# Patient Record
Sex: Male | Born: 1940 | Race: White | Hispanic: No | Marital: Married | Smoking: Former smoker
Health system: Southern US, Community
[De-identification: ages and names within clinical notes are randomized; demographics above are authoritative.]

## PROBLEM LIST (undated history)

## (undated) DIAGNOSIS — R399 Unspecified symptoms and signs involving the genitourinary system: Secondary | ICD-10-CM

## (undated) DIAGNOSIS — K219 Gastro-esophageal reflux disease without esophagitis: Secondary | ICD-10-CM

## (undated) DIAGNOSIS — K449 Diaphragmatic hernia without obstruction or gangrene: Secondary | ICD-10-CM

## (undated) DIAGNOSIS — Z87898 Personal history of other specified conditions: Secondary | ICD-10-CM

## (undated) DIAGNOSIS — Z8619 Personal history of other infectious and parasitic diseases: Secondary | ICD-10-CM

## (undated) DIAGNOSIS — N400 Enlarged prostate without lower urinary tract symptoms: Secondary | ICD-10-CM

## (undated) DIAGNOSIS — M1990 Unspecified osteoarthritis, unspecified site: Secondary | ICD-10-CM

## (undated) DIAGNOSIS — Z973 Presence of spectacles and contact lenses: Secondary | ICD-10-CM

## (undated) HISTORY — PX: VARICOSE VEIN SURGERY: SHX832

## (undated) HISTORY — PX: COLONOSCOPY: SHX174

## (undated) HISTORY — PX: INGUINAL HERNIA REPAIR: SUR1180

## (undated) HISTORY — DX: Personal history of other infectious and parasitic diseases: Z86.19

## (undated) HISTORY — PX: PILONIDAL CYST / SINUS EXCISION: SUR543

---
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
## 2004-10-07 ENCOUNTER — Encounter: Admission: RE | Admit: 2004-10-07 | Discharge: 2004-10-07 | Payer: Self-pay | Admitting: Otolaryngology

## 2005-10-11 IMAGING — CT CT NECK W/ CM
1 of 2 series · 10 of 14 positions shown, 13 images · IV contrast (75ML OMNI 300)
Comparison: none

CLINICAL DATA: Left salivary gland palpable abnormality.  Numbness left TM joint region.
TECHNIQUE: Multidetector spiral axial images were obtained from the level of the frontal sinuses to the aortic arch with IV injection of 75 cc Omnipaque 300.  
 CT NECK WITH CONTRAST:
 The bilateral parotid and submandibular glands are normal by CT assessment.  No cervical adenopathy is seen.  Naso-oro-hypopharynx, larynx appear normal by CT assessment.  3 mm low density focus at otherwise normal size thyroid is likely incidental adenoma.  Lung apices are clear with no superior mediastinal adenopathy/mass.  Visualized paranasal sinuses, bilateral middle ear, external auditory canals and mastoid air cells appear clear.  Degenerative change is seen at the left uncinate joint of C4-5 and bilateral uncinate joints of C5-6 with slight posterior osteophytic ridging, uncinate degenerative joint disease and slight bilateral neural foraminal narrowing at C5-6 and right C6-7.

[Series 2: neck w/ · axial · 0.39mm/px · z∈[-271,-19]mm · 10 of 125 slices shown, 13 images]
[im 12/125  soft-tissue]
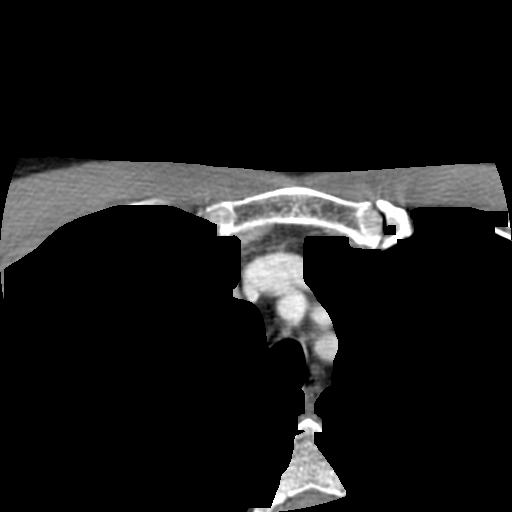
[im 12/125  bone]
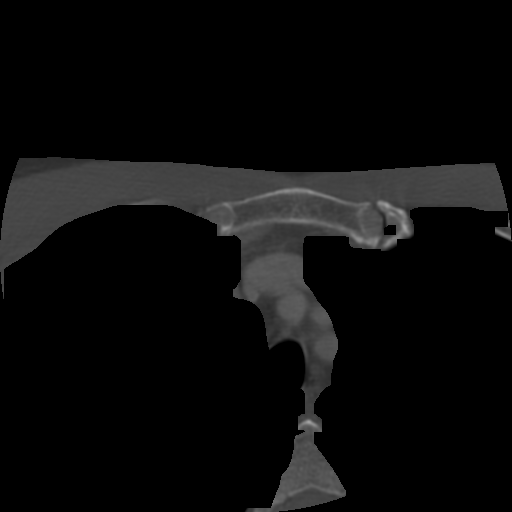
[im 23/125  bone]
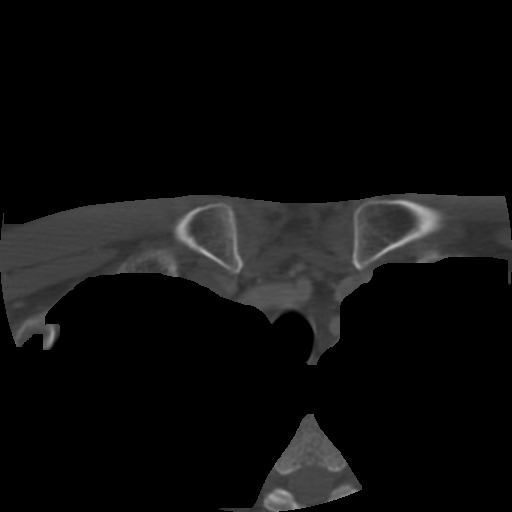
[im 34/125  bone]
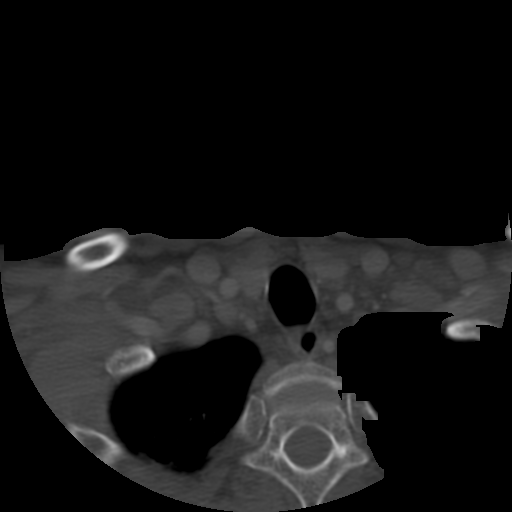
[im 46/125  bone]
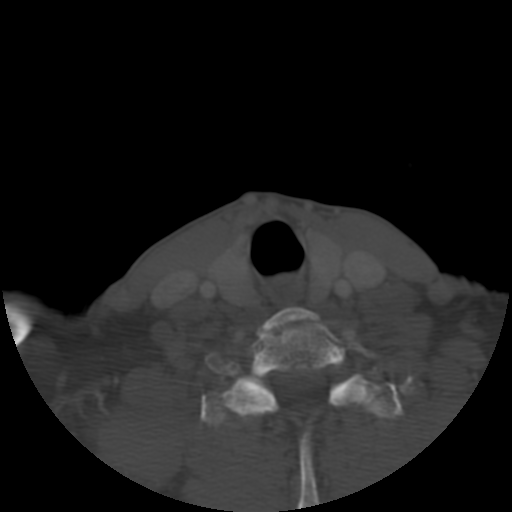
[im 57/125  soft-tissue]
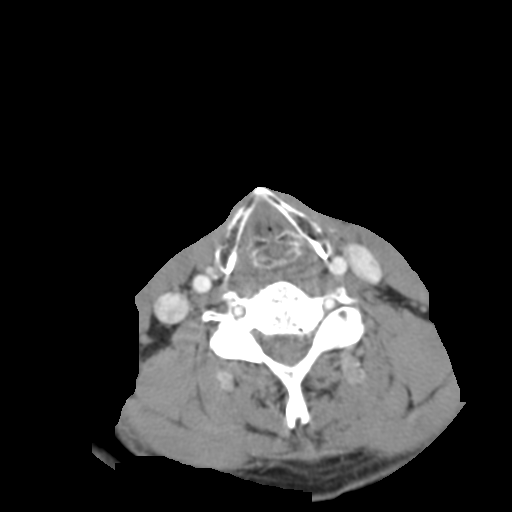
[im 57/125  bone]
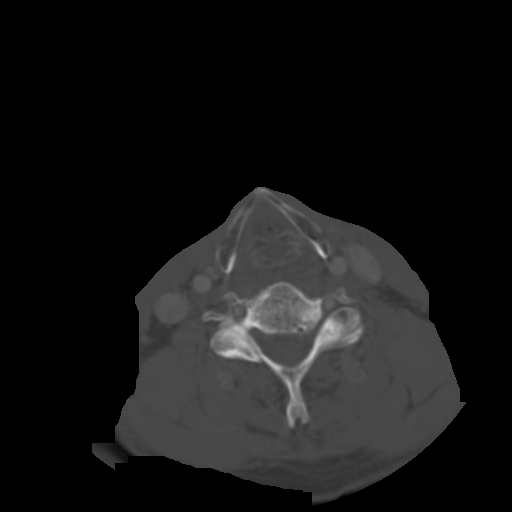
[im 68/125  bone]
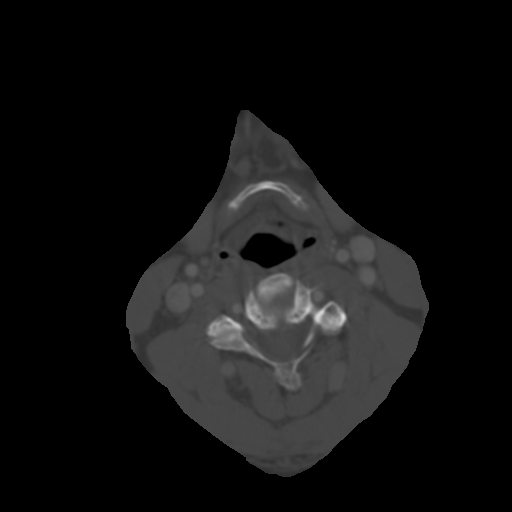
[im 79/125  bone]
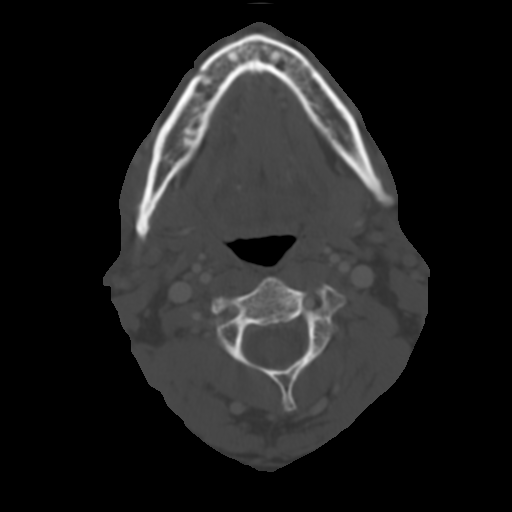
[im 91/125  bone]
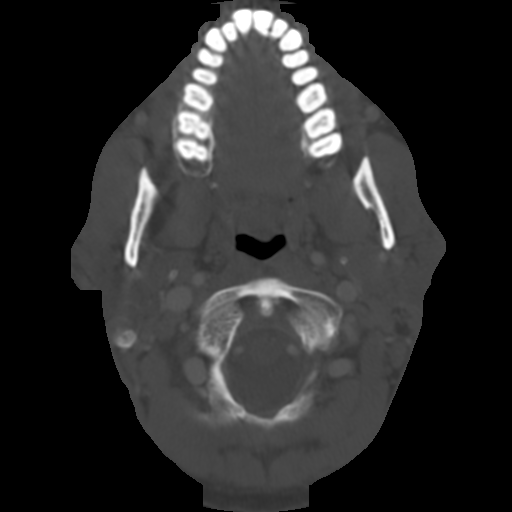
[im 102/125  soft-tissue]
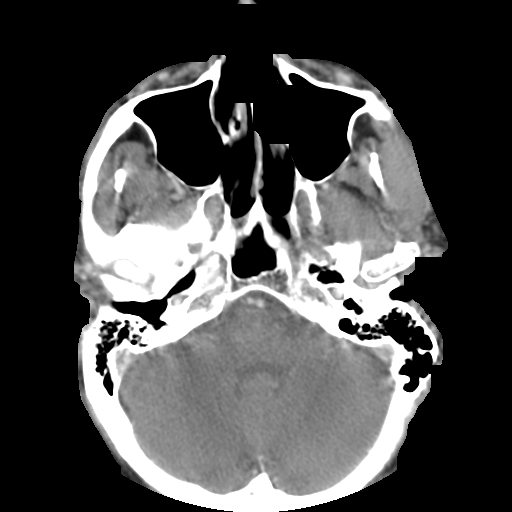
[im 102/125  bone]
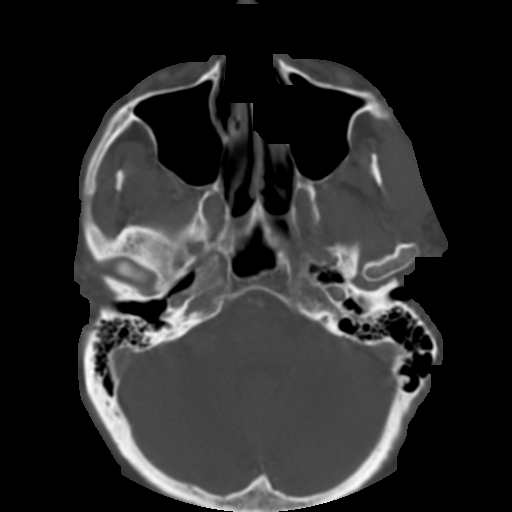
[im 113/125  bone]
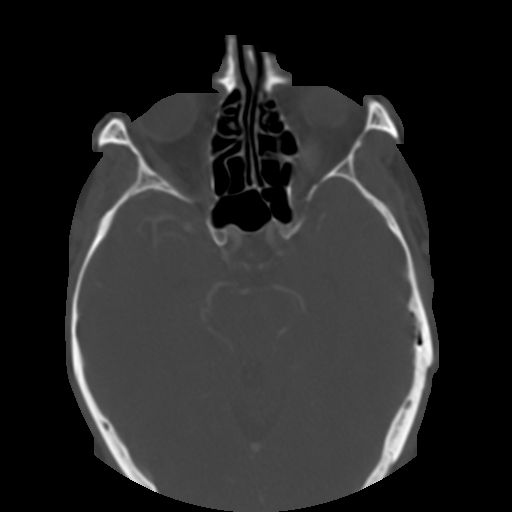

[10 of 14 positions shown; findings below may reference images not displayed]

IMPRESSION: 1.  Degenerative changes in the cervical spine as described.
 2.  Likely incidental 3 mm left thyroid adenoma.
 3.  Otherwise negative.

## 2006-09-03 ENCOUNTER — Encounter: Admission: RE | Admit: 2006-09-03 | Discharge: 2006-09-03 | Payer: Self-pay | Admitting: Family Medicine

## 2008-12-05 HISTORY — PX: CARDIOVASCULAR STRESS TEST: SHX262

## 2011-03-27 ENCOUNTER — Telehealth: Payer: Self-pay | Admitting: Cardiology

## 2011-04-07 ENCOUNTER — Telehealth: Payer: Self-pay | Admitting: Cardiovascular Disease

## 2011-04-07 NOTE — Telephone Encounter (Signed)
Faxed Last OV Note

## 2011-05-29 NOTE — Telephone Encounter (Signed)
No further information

## 2012-01-19 ENCOUNTER — Encounter (HOSPITAL_COMMUNITY): Payer: Self-pay | Admitting: *Deleted

## 2012-01-19 ENCOUNTER — Emergency Department (HOSPITAL_COMMUNITY): Payer: 59

## 2012-01-19 ENCOUNTER — Emergency Department (HOSPITAL_COMMUNITY)
Admission: EM | Admit: 2012-01-19 | Discharge: 2012-01-19 | Disposition: A | Discharge status: Home / Self Care | Payer: 59 | Attending: Emergency Medicine | Admitting: Emergency Medicine

## 2012-01-19 DIAGNOSIS — R7989 Other specified abnormal findings of blood chemistry: Secondary | ICD-10-CM | POA: Insufficient documentation

## 2012-01-19 DIAGNOSIS — K219 Gastro-esophageal reflux disease without esophagitis: Secondary | ICD-10-CM | POA: Insufficient documentation

## 2012-01-19 DIAGNOSIS — K449 Diaphragmatic hernia without obstruction or gangrene: Secondary | ICD-10-CM | POA: Insufficient documentation

## 2012-01-19 DIAGNOSIS — R0781 Pleurodynia: Secondary | ICD-10-CM

## 2012-01-19 DIAGNOSIS — R0602 Shortness of breath: Secondary | ICD-10-CM | POA: Insufficient documentation

## 2012-01-19 DIAGNOSIS — R071 Chest pain on breathing: Secondary | ICD-10-CM | POA: Insufficient documentation

## 2012-01-19 HISTORY — DX: Gastro-esophageal reflux disease without esophagitis: K21.9

## 2012-01-19 LAB — DIFFERENTIAL
Basophils Absolute: 0 K/uL (ref 0.0–0.1)
Basophils Relative: 1 % (ref 0–1)
Eosinophils Absolute: 0.1 K/uL (ref 0.0–0.7)
Eosinophils Relative: 2 % (ref 0–5)
Lymphocytes Relative: 28 % (ref 12–46)
Lymphs Abs: 1.5 10*3/uL (ref 0.7–4.0)
Monocytes Absolute: 0.5 10*3/uL (ref 0.1–1.0)
Monocytes Relative: 9 % (ref 3–12)
Neutro Abs: 3.3 K/uL (ref 1.7–7.7)
Neutrophils Relative %: 61 % (ref 43–77)

## 2012-01-19 LAB — CBC
HCT: 40.8 % (ref 39.0–52.0)
Hemoglobin: 14.3 g/dL (ref 13.0–17.0)
MCH: 31.8 pg (ref 26.0–34.0)
MCHC: 35 g/dL (ref 30.0–36.0)
MCV: 90.9 fL (ref 78.0–100.0)
Platelets: 191 10*3/uL (ref 150–400)
RBC: 4.49 MIL/uL (ref 4.22–5.81)
RDW: 12.3 % (ref 11.5–15.5)
WBC: 5.5 10*3/uL (ref 4.0–10.5)

## 2012-01-19 LAB — D-DIMER, QUANTITATIVE: D-Dimer, Quant: <0.22 ug{FEU}/mL (ref 0.00–0.48)

## 2012-01-19 LAB — BASIC METABOLIC PANEL
CO2: 26 mEq/L (ref 19–32)
Calcium: 8.9 mg/dL (ref 8.4–10.5)
Chloride: 108 mEq/L (ref 96–112)
Glucose, Bld: 109 mg/dL — ABNORMAL HIGH (ref 70–99)
Sodium: 142 mEq/L (ref 135–145)

## 2012-01-19 LAB — TROPONIN I: Troponin I: <0.3 ng/mL (ref <0.30)

## 2012-01-19 LAB — BASIC METABOLIC PANEL WITH GFR
BUN: 27 mg/dL — ABNORMAL HIGH (ref 6–23)
Creatinine, Ser: 1.1 mg/dL (ref 0.50–1.35)
GFR calc Af Amer: 78 mL/min — ABNORMAL LOW (ref >90)
GFR calc non Af Amer: 67 mL/min — ABNORMAL LOW (ref >90)
Potassium: 4.4 meq/L (ref 3.5–5.1)

## 2012-01-19 MED ORDER — GI COCKTAIL ~~LOC~~
30.0000 mL | Freq: Once | ORAL | Status: AC
  Administered 2012-01-19: 30 mL via ORAL
  Filled 2012-01-19: qty 30

## 2012-01-19 MED ORDER — SODIUM CHLORIDE 0.9 % IV BOLUS (SEPSIS)
1000.0000 mL | Freq: Once | INTRAVENOUS | Status: AC
  Administered 2012-01-19: 1000 mL via INTRAVENOUS

## 2012-01-19 MED ORDER — KETOROLAC TROMETHAMINE 30 MG/ML IJ SOLN
30.0000 mg | Freq: Once | INTRAMUSCULAR | Status: AC
  Administered 2012-01-19: 30 mg via INTRAVENOUS
  Filled 2012-01-19: qty 1

## 2012-01-19 NOTE — ED Notes (Signed)
Per EMS pt from home with c/o chest pain onset around 0500. Pt reports pain sharp with deep inspiration. Pt received Aspirin 324 mg PO and Nitro x 1 with no change in pain. Alert and oriented x 4. IV LFA 18G. Denies nausea/vomiting. VS BP 120/74 HR 62 NSR.

## 2012-01-19 NOTE — ED Notes (Signed)
MD at bedside. 

## 2012-01-19 NOTE — ED Provider Notes (Signed)
History     CSN: [id number]  Arrival date & time 01/19/12  4098   First MD Initiated Contact with Patient 01/19/12 [phone]      Chief Complaint  Patient presents with  . Chest Pain    (Consider location/radiation/quality/duration/timing/severity/associated sxs/prior treatment) HPI Comments: Pt reports woke up earlier this AM with a sharp pain in left breast area, non radiating worse with deep inspiration.  Has a slight steady ache now since pain first began.  But with holding breath or shallow breathing, no pain.  No fevers, cough, no congestion, denies eating anything unusual last night, no bloating.  He has burping and gas, but not unusual for him.  No constipation, diarrhea.  No sweats, feels a little SOB because he can't breathe deeply.  No recent travel, no lower leg swelling or edema or pain.  He has h/o hiatal hernia, takes prilosec.  Denies smoking history.    Patient is a 71 y.o. male presenting with chest pain. The history is provided by the patient.  Chest Pain Primary symptoms include shortness of breath. Pertinent negatives for primary symptoms include no fever, no cough, no palpitations, no abdominal pain, no nausea, no vomiting and no dizziness.  Pertinent negatives for associated symptoms include no diaphoresis.     Past Medical History  Diagnosis Date  . GERD (gastroesophageal reflux disease)     History reviewed. No pertinent past surgical history.  History reviewed. No pertinent family history.  History  Substance Use Topics  . Smoking status: Never Smoker   . Smokeless tobacco: Not on file  . Alcohol Use: No      Review of Systems  Constitutional: Negative for fever, chills and diaphoresis.  HENT: Negative for congestion.   Respiratory: Positive for shortness of breath. Negative for cough.   Cardiovascular: Positive for chest pain. Negative for palpitations and leg swelling.  Gastrointestinal: Negative for nausea, vomiting, abdominal pain and diarrhea.    Neurological: Negative for dizziness, light-headedness and headaches.  All other systems reviewed and are negative.    Allergies  Review of patient's allergies indicates no known allergies.  Home Medications   Current Outpatient Rx  Name Route Sig Dispense Refill  . VITAMIN D PO Oral Take 1 tablet by mouth daily.    Marland Kitchen COENZYME Q10 30 MG PO CAPS Oral Take 30 mg by mouth daily.    . IBUPROFEN 200 MG PO TABS Oral Take 600 mg by mouth daily.    . ADULT MULTIVITAMIN W/MINERALS CH Oral Take 1 tablet by mouth daily.    . OMEGA-3-ACID ETHYL ESTERS 1 G PO CAPS Oral Take 1 g by mouth daily.    Marland Kitchen OMEPRAZOLE MAGNESIUM 20 MG PO TBEC Oral Take 20 mg by mouth 2 (two) times daily.      BP 125/80  Pulse 60  Temp(Src) 98.2 F (36.8 C) (Oral)  Resp 16  Ht 5\' 11"  (1.803 m)  Wt 185 lb (83.915 kg)  BMI 25.80 kg/m2  SpO2 100%  Physical Exam  Nursing note and vitals reviewed. Constitutional: He is oriented to person, place, and time. He appears well-developed and well-nourished. No distress.  HENT:  Head: Normocephalic and atraumatic.  Eyes: Pupils are equal, round, and reactive to light. No scleral icterus.  Neck: Neck supple.  Cardiovascular: Normal rate and regular rhythm.   No murmur heard. Pulmonary/Chest: Effort normal. No respiratory distress. He has no wheezes. He has no rales.  Abdominal: Soft. He exhibits no distension. There is no tenderness. There is  no rebound and no guarding.  Musculoskeletal: He exhibits no edema and no tenderness.  Neurological: He is alert and oriented to person, place, and time.  Skin: Skin is warm and dry. No rash noted. No pallor.  Psychiatric: He has a normal mood and affect.    ED Course  Procedures (including critical care time)  Labs Reviewed  BASIC METABOLIC PANEL - Abnormal; Notable for the following:    Glucose, Bld 109 (*)    BUN 27 (*)    GFR calc non Af Amer 67 (*)    GFR calc Af Amer 78 (*)    All other components within normal limits   D-DIMER, QUANTITATIVE  CBC  DIFFERENTIAL  TROPONIN I   Dg Chest Port 1 View  01/19/2012  *RADIOLOGY REPORT*  Clinical Data:  chest pain, shortness of breath  PORTABLE CHEST - 1 VIEW  Comparison: 04/29/2010  Findings: Normal heart size and vascularity.  No focal airspace disease, collapse, consolidation, edema, effusion or pneumothorax. Trachea midline.  IMPRESSION: No acute chest process  Original Report Authenticated By: Judie Petit. TREVOR Miles Costain, M.D.     1. Pleuritic chest pain     RA sat is 100% and normal  ECG at time 07:24 shows sinus rhythm with normal axis, normal intervals, no ST or T wave abn's.  No prior ECG   8:47 AM BUN is quite elevated.  Will give IVF bolus.  ddimer is normal.  PCXR is normal per radiologist. I reviewed it myself and reviewed interpretation.  GI cocktail did not improve pain, by history, pain is quite pleuritic, will give IV toradol.     10:22 AM Pt feels somewhat improved, no arrythmia on monitor, discussed findings of labs, CXR with pt and family, they agree with NSAIDs at home and rest, can follow up with PCP, return if worse or symptoms change.    MDM  Pleuritic pain.  Pt has h/o GERD and hiatal hernia, will try gi cocktail.  No ischemia on ECG.  No evidence of ACS based on history or exam.  Will check CXR and ddimer, although no obvious risk for PE.          Gavin Pound. Zitlaly Malson, MD 01/19/12 1022

## 2012-01-19 NOTE — ED Notes (Signed)
Pt reports sharp pain on inspiration since 0500. States did a lot of yardwork yesterday, building a wall in the garden. Denies shortness of breath- states hard to take deep breath. Denies dizziness, nausea/vomiting, diaphoresis.

## 2012-02-04 ENCOUNTER — Encounter: Payer: Self-pay | Admitting: *Deleted

## 2012-07-04 DIAGNOSIS — Z79899 Other long term (current) drug therapy: Secondary | ICD-10-CM | POA: Diagnosis not present

## 2012-07-04 DIAGNOSIS — Z23 Encounter for immunization: Secondary | ICD-10-CM | POA: Diagnosis not present

## 2012-07-04 DIAGNOSIS — Z136 Encounter for screening for cardiovascular disorders: Secondary | ICD-10-CM | POA: Diagnosis not present

## 2012-07-04 DIAGNOSIS — N529 Male erectile dysfunction, unspecified: Secondary | ICD-10-CM | POA: Diagnosis not present

## 2012-07-04 DIAGNOSIS — N400 Enlarged prostate without lower urinary tract symptoms: Secondary | ICD-10-CM | POA: Diagnosis not present

## 2012-07-04 DIAGNOSIS — Z0000 Encounter for general adult medical examination without abnormal findings: Secondary | ICD-10-CM | POA: Diagnosis not present

## 2012-07-04 DIAGNOSIS — Z6826 Body mass index (BMI) 26.0-26.9, adult: Secondary | ICD-10-CM | POA: Diagnosis not present

## 2012-07-04 DIAGNOSIS — Z125 Encounter for screening for malignant neoplasm of prostate: Secondary | ICD-10-CM | POA: Diagnosis not present

## 2012-08-04 DIAGNOSIS — K219 Gastro-esophageal reflux disease without esophagitis: Secondary | ICD-10-CM | POA: Diagnosis not present

## 2012-08-04 DIAGNOSIS — E291 Testicular hypofunction: Secondary | ICD-10-CM | POA: Diagnosis not present

## 2012-08-04 DIAGNOSIS — N400 Enlarged prostate without lower urinary tract symptoms: Secondary | ICD-10-CM | POA: Diagnosis not present

## 2012-10-18 ENCOUNTER — Encounter: Payer: Self-pay | Admitting: Cardiovascular Disease

## 2013-01-22 IMAGING — DX DG CHEST 1V PORT
1 series · 1 of 1 positions shown · non-contrast
Comparison: 04/29/2010

CLINICAL DATA: chest pain, shortness of breath

PORTABLE CHEST - 1 VIEW

[AP]
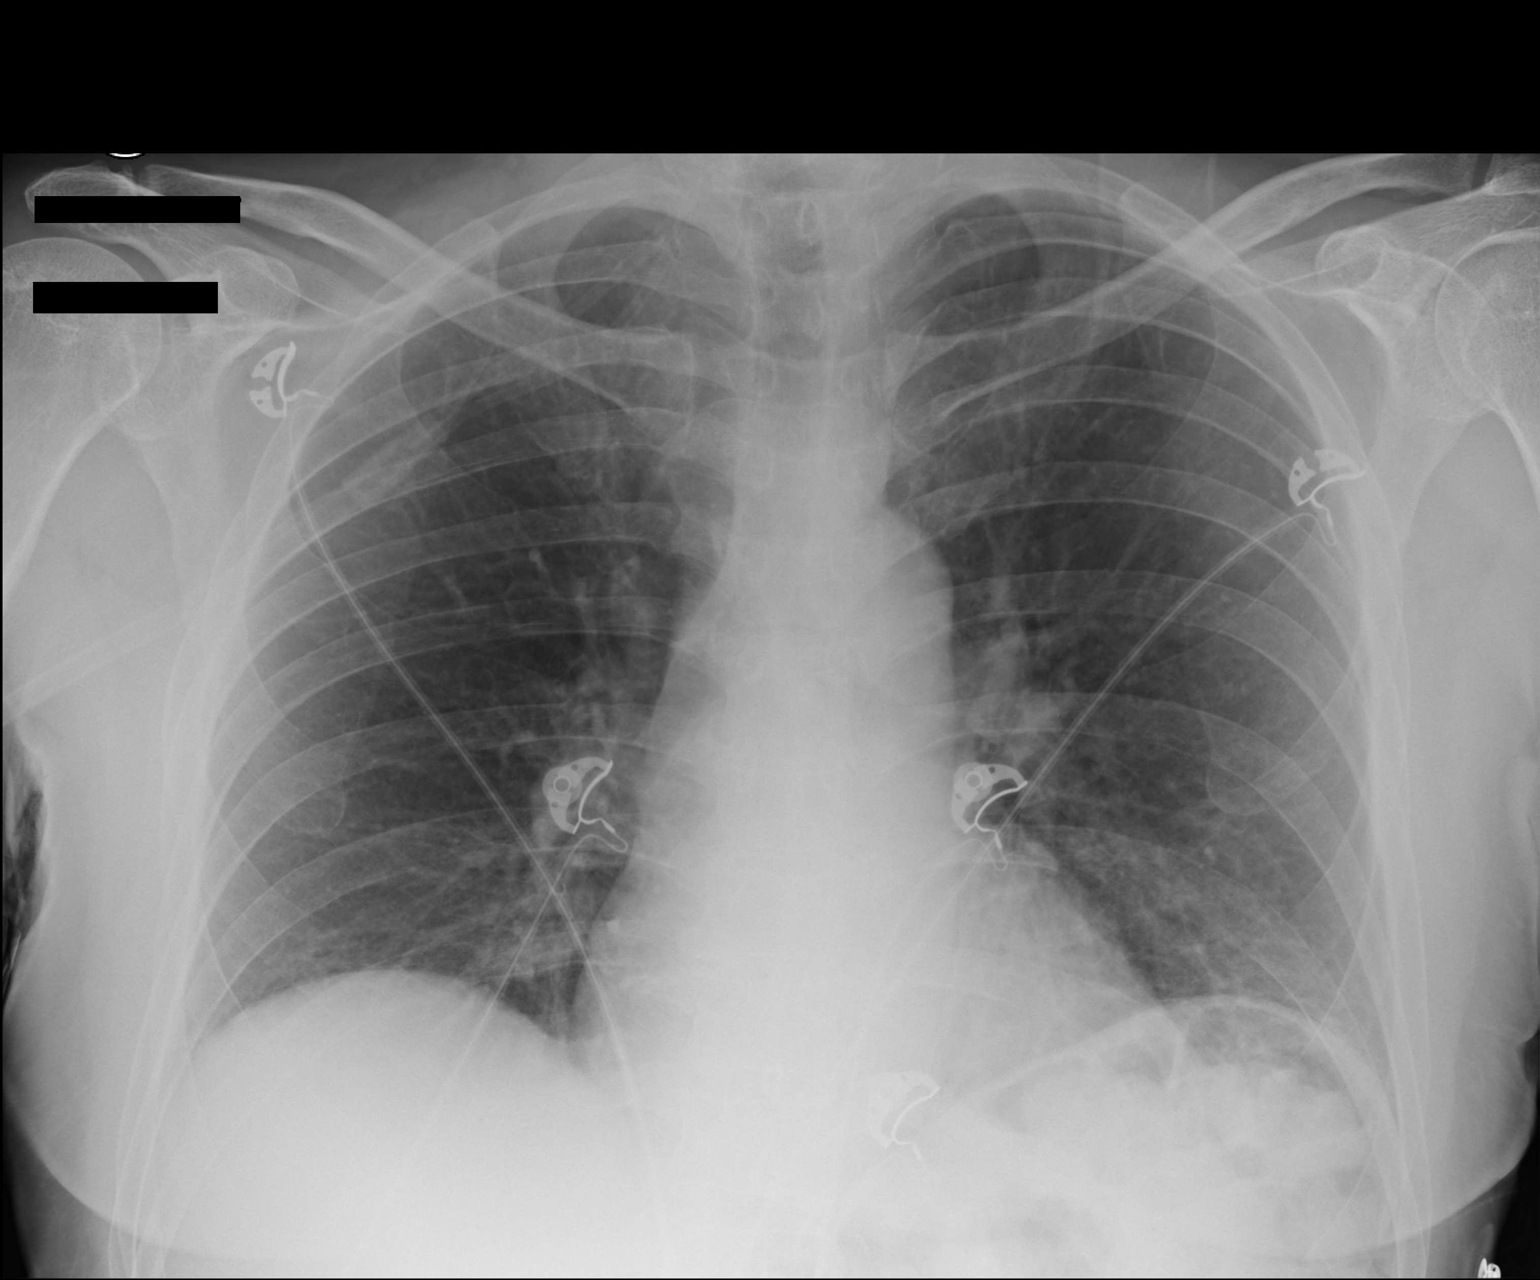

[1 of 1 positions shown; findings below may reference images not displayed]

FINDINGS: Normal heart size and vascularity.  No focal airspace
disease, collapse, consolidation, edema, effusion or pneumothorax.
Trachea midline.
IMPRESSION: No acute chest process

## 2014-02-13 DIAGNOSIS — N410 Acute prostatitis: Secondary | ICD-10-CM | POA: Diagnosis not present

## 2014-02-13 DIAGNOSIS — R5381 Other malaise: Secondary | ICD-10-CM | POA: Diagnosis not present

## 2014-02-13 DIAGNOSIS — R6889 Other general symptoms and signs: Secondary | ICD-10-CM | POA: Diagnosis not present

## 2014-02-13 DIAGNOSIS — N138 Other obstructive and reflux uropathy: Secondary | ICD-10-CM | POA: Diagnosis not present

## 2014-02-13 DIAGNOSIS — Z125 Encounter for screening for malignant neoplasm of prostate: Secondary | ICD-10-CM | POA: Diagnosis not present

## 2014-02-13 DIAGNOSIS — B070 Plantar wart: Secondary | ICD-10-CM | POA: Diagnosis not present

## 2014-02-13 DIAGNOSIS — N401 Enlarged prostate with lower urinary tract symptoms: Secondary | ICD-10-CM | POA: Diagnosis not present

## 2014-02-27 DIAGNOSIS — B070 Plantar wart: Secondary | ICD-10-CM | POA: Diagnosis not present

## 2014-11-05 DIAGNOSIS — N400 Enlarged prostate without lower urinary tract symptoms: Secondary | ICD-10-CM | POA: Diagnosis not present

## 2014-11-05 DIAGNOSIS — Z6826 Body mass index (BMI) 26.0-26.9, adult: Secondary | ICD-10-CM | POA: Diagnosis not present

## 2014-11-05 DIAGNOSIS — R0789 Other chest pain: Secondary | ICD-10-CM | POA: Diagnosis not present

## 2014-11-05 DIAGNOSIS — Z9181 History of falling: Secondary | ICD-10-CM | POA: Diagnosis not present

## 2014-11-05 DIAGNOSIS — Z136 Encounter for screening for cardiovascular disorders: Secondary | ICD-10-CM | POA: Diagnosis not present

## 2014-11-05 DIAGNOSIS — Z79899 Other long term (current) drug therapy: Secondary | ICD-10-CM | POA: Diagnosis not present

## 2014-11-05 DIAGNOSIS — Z0000 Encounter for general adult medical examination without abnormal findings: Secondary | ICD-10-CM | POA: Diagnosis not present

## 2014-11-05 DIAGNOSIS — K219 Gastro-esophageal reflux disease without esophagitis: Secondary | ICD-10-CM | POA: Diagnosis not present

## 2015-02-05 DIAGNOSIS — Z6825 Body mass index (BMI) 25.0-25.9, adult: Secondary | ICD-10-CM | POA: Diagnosis not present

## 2015-02-05 DIAGNOSIS — N400 Enlarged prostate without lower urinary tract symptoms: Secondary | ICD-10-CM | POA: Diagnosis not present

## 2015-05-08 DIAGNOSIS — Z125 Encounter for screening for malignant neoplasm of prostate: Secondary | ICD-10-CM | POA: Diagnosis not present

## 2015-05-08 DIAGNOSIS — Z79899 Other long term (current) drug therapy: Secondary | ICD-10-CM | POA: Diagnosis not present

## 2015-11-15 DIAGNOSIS — H2513 Age-related nuclear cataract, bilateral: Secondary | ICD-10-CM | POA: Diagnosis not present

## 2015-11-19 DIAGNOSIS — R6889 Other general symptoms and signs: Secondary | ICD-10-CM | POA: Diagnosis not present

## 2015-11-19 DIAGNOSIS — Z1389 Encounter for screening for other disorder: Secondary | ICD-10-CM | POA: Diagnosis not present

## 2015-11-19 DIAGNOSIS — K219 Gastro-esophageal reflux disease without esophagitis: Secondary | ICD-10-CM | POA: Diagnosis not present

## 2015-11-19 DIAGNOSIS — L989 Disorder of the skin and subcutaneous tissue, unspecified: Secondary | ICD-10-CM | POA: Diagnosis not present

## 2015-11-19 DIAGNOSIS — N400 Enlarged prostate without lower urinary tract symptoms: Secondary | ICD-10-CM | POA: Diagnosis not present

## 2015-11-19 DIAGNOSIS — Z6826 Body mass index (BMI) 26.0-26.9, adult: Secondary | ICD-10-CM | POA: Diagnosis not present

## 2015-11-19 DIAGNOSIS — Z9181 History of falling: Secondary | ICD-10-CM | POA: Diagnosis not present

## 2015-11-26 DIAGNOSIS — E663 Overweight: Secondary | ICD-10-CM | POA: Diagnosis not present

## 2015-11-26 DIAGNOSIS — Z6826 Body mass index (BMI) 26.0-26.9, adult: Secondary | ICD-10-CM | POA: Diagnosis not present

## 2015-11-26 DIAGNOSIS — L820 Inflamed seborrheic keratosis: Secondary | ICD-10-CM | POA: Diagnosis not present

## 2015-11-26 DIAGNOSIS — L989 Disorder of the skin and subcutaneous tissue, unspecified: Secondary | ICD-10-CM | POA: Diagnosis not present

## 2015-12-11 DIAGNOSIS — H2513 Age-related nuclear cataract, bilateral: Secondary | ICD-10-CM | POA: Diagnosis not present

## 2015-12-16 ENCOUNTER — Encounter: Payer: Self-pay | Admitting: *Deleted

## 2015-12-19 ENCOUNTER — Ambulatory Visit: Payer: Medicare Other | Admitting: Anesthesiology

## 2015-12-19 ENCOUNTER — Ambulatory Visit
Admission: RE | Admit: 2015-12-19 | Discharge: 2015-12-19 | Disposition: A | Discharge status: Home / Self Care | Payer: Medicare Other | Source: Ambulatory Visit | Attending: Ophthalmology | Admitting: Ophthalmology

## 2015-12-19 ENCOUNTER — Encounter
Admission: RE | Disposition: A | Discharge status: Home / Self Care | Payer: Self-pay | Source: Ambulatory Visit | Attending: Ophthalmology

## 2015-12-19 DIAGNOSIS — H2511 Age-related nuclear cataract, right eye: Secondary | ICD-10-CM | POA: Insufficient documentation

## 2015-12-19 DIAGNOSIS — M7989 Other specified soft tissue disorders: Secondary | ICD-10-CM | POA: Diagnosis not present

## 2015-12-19 DIAGNOSIS — Z87891 Personal history of nicotine dependence: Secondary | ICD-10-CM | POA: Diagnosis not present

## 2015-12-19 DIAGNOSIS — I499 Cardiac arrhythmia, unspecified: Secondary | ICD-10-CM | POA: Insufficient documentation

## 2015-12-19 DIAGNOSIS — M1990 Unspecified osteoarthritis, unspecified site: Secondary | ICD-10-CM | POA: Insufficient documentation

## 2015-12-19 DIAGNOSIS — Z8619 Personal history of other infectious and parasitic diseases: Secondary | ICD-10-CM | POA: Insufficient documentation

## 2015-12-19 DIAGNOSIS — K449 Diaphragmatic hernia without obstruction or gangrene: Secondary | ICD-10-CM | POA: Insufficient documentation

## 2015-12-19 DIAGNOSIS — R002 Palpitations: Secondary | ICD-10-CM | POA: Insufficient documentation

## 2015-12-19 DIAGNOSIS — H2513 Age-related nuclear cataract, bilateral: Secondary | ICD-10-CM | POA: Diagnosis not present

## 2015-12-19 DIAGNOSIS — K219 Gastro-esophageal reflux disease without esophagitis: Secondary | ICD-10-CM | POA: Diagnosis not present

## 2015-12-19 HISTORY — DX: Unspecified osteoarthritis, unspecified site: M19.90

## 2015-12-19 HISTORY — PX: CATARACT EXTRACTION W/PHACO: SHX586

## 2015-12-19 SURGERY — PHACOEMULSIFICATION, CATARACT, WITH IOL INSERTION
Anesthesia: Monitor Anesthesia Care | Site: Eye | Laterality: Right | Wound class: Clean

## 2015-12-19 MED ORDER — LIDOCAINE HCL (PF) 4 % IJ SOLN
INTRAOCULAR | Status: DC | PRN
  Administered 2015-12-19: 11:00:00 via OPHTHALMIC

## 2015-12-19 MED ORDER — NA HYALUR & NA CHOND-NA HYALUR 0.4-0.35 ML IO KIT
PACK | INTRAOCULAR | Status: DC | PRN
  Administered 2015-12-19: .35 mL via INTRAOCULAR

## 2015-12-19 MED ORDER — LIDOCAINE HCL (PF) 1 % IJ SOLN
INTRAMUSCULAR | Status: AC
  Filled 2015-12-19: qty 2

## 2015-12-19 MED ORDER — MIDAZOLAM HCL 2 MG/2ML IJ SOLN
INTRAMUSCULAR | Status: DC | PRN
  Administered 2015-12-19: 1 mg via INTRAVENOUS

## 2015-12-19 MED ORDER — MOXIFLOXACIN HCL 0.5 % OP SOLN
OPHTHALMIC | Status: AC
  Filled 2015-12-19: qty 3

## 2015-12-19 MED ORDER — LIDOCAINE HCL (PF) 4 % IJ SOLN
INTRAMUSCULAR | Status: AC
  Filled 2015-12-19: qty 5

## 2015-12-19 MED ORDER — SODIUM CHLORIDE 0.9 % IV SOLN
INTRAVENOUS | Status: DC
  Administered 2015-12-19: 10:00:00 via INTRAVENOUS

## 2015-12-19 MED ORDER — POVIDONE-IODINE 5 % OP SOLN
OPHTHALMIC | Status: AC
  Administered 2015-12-19: 1 via OPHTHALMIC
  Filled 2015-12-19: qty 30

## 2015-12-19 MED ORDER — EPINEPHRINE HCL 1 MG/ML IJ SOLN
INTRAMUSCULAR | Status: AC
  Filled 2015-12-19: qty 1

## 2015-12-19 MED ORDER — NA HYALUR & NA CHOND-NA HYALUR 0.55-0.5 ML IO KIT
PACK | INTRAOCULAR | Status: AC
  Filled 2015-12-19: qty 1.05

## 2015-12-19 MED ORDER — TETRACAINE HCL 0.5 % OP SOLN
1.0000 [drp] | Freq: Once | OPHTHALMIC | Status: AC
  Administered 2015-12-19: 1 [drp] via OPHTHALMIC

## 2015-12-19 MED ORDER — NEOMYCIN-POLYMYXIN-DEXAMETH 0.1 % OP OINT
TOPICAL_OINTMENT | OPHTHALMIC | Status: DC | PRN
  Administered 2015-12-19: 1 via OPHTHALMIC

## 2015-12-19 MED ORDER — ARMC OPHTHALMIC DILATING GEL
1.0000 "application " | OPHTHALMIC | Status: AC | PRN
  Administered 2015-12-19 (×2): 1 via OPHTHALMIC

## 2015-12-19 MED ORDER — FENTANYL CITRATE (PF) 100 MCG/2ML IJ SOLN
INTRAMUSCULAR | Status: DC | PRN
  Administered 2015-12-19: 1 ug via INTRAVENOUS

## 2015-12-19 MED ORDER — BSS IO SOLN
INTRAOCULAR | Status: DC | PRN
  Administered 2015-12-19: 11:00:00 via OPHTHALMIC

## 2015-12-19 MED ORDER — CEFUROXIME OPHTHALMIC INJECTION 1 MG/0.1 ML
INJECTION | OPHTHALMIC | Status: AC
  Filled 2015-12-19: qty 0.1

## 2015-12-19 MED ORDER — CEFUROXIME OPHTHALMIC INJECTION 1 MG/0.1 ML
INJECTION | OPHTHALMIC | Status: DC | PRN
  Administered 2015-12-19: 0.1 mL via INTRACAMERAL

## 2015-12-19 MED ORDER — TETRACAINE HCL 0.5 % OP SOLN
OPHTHALMIC | Status: AC
  Administered 2015-12-19: 1 [drp] via OPHTHALMIC
  Filled 2015-12-19: qty 2

## 2015-12-19 MED ORDER — POVIDONE-IODINE 5 % OP SOLN
1.0000 "application " | Freq: Once | OPHTHALMIC | Status: AC
  Administered 2015-12-19: 1 via OPHTHALMIC

## 2015-12-19 MED ORDER — MOXIFLOXACIN HCL 0.5 % OP SOLN: 1.0000 [drp] | OPHTHALMIC | Status: DC | PRN

## 2015-12-19 MED ORDER — CARBACHOL 0.01 % IO SOLN
INTRAOCULAR | Status: DC | PRN
  Administered 2015-12-19: 0.5 mL via INTRAOCULAR

## 2015-12-19 SURGICAL SUPPLY — 23 items
CANNULA ANT/CHMB 27G (MISCELLANEOUS) ×1 IMPLANT
CANNULA ANT/CHMB 27GA (MISCELLANEOUS) ×3 IMPLANT
CUP MEDICINE 2OZ PLAST GRAD ST (MISCELLANEOUS) ×3 IMPLANT
GLOVE BIO SURGEON STRL SZ8 (GLOVE) ×3 IMPLANT
GLOVE BIOGEL M 6.5 STRL (GLOVE) ×3 IMPLANT
GLOVE SURG LX 7.5 STRW (GLOVE) ×2
GLOVE SURG LX STRL 7.5 STRW (GLOVE) ×1 IMPLANT
GOWN STRL REUS W/ TWL LRG LVL3 (GOWN DISPOSABLE) ×2 IMPLANT
GOWN STRL REUS W/TWL LRG LVL3 (GOWN DISPOSABLE) ×6
LENS IOL TECNIS 17.5 (Intraocular Lens) ×3 IMPLANT
LENS IOL TECNIS MONO 1P 17.5 (Intraocular Lens) IMPLANT
PACK CATARACT (MISCELLANEOUS) ×3 IMPLANT
PACK CATARACT BRASINGTON LX (MISCELLANEOUS) ×3 IMPLANT
PACK EYE AFTER SURG (MISCELLANEOUS) ×3 IMPLANT
SOL BSS BAG (MISCELLANEOUS) ×3
SOL PREP PVP 2OZ (MISCELLANEOUS) ×3
SOLUTION BSS BAG (MISCELLANEOUS) ×1 IMPLANT
SOLUTION PREP PVP 2OZ (MISCELLANEOUS) ×1 IMPLANT
SYR 3ML LL SCALE MARK (SYRINGE) ×3 IMPLANT
SYR 5ML LL (SYRINGE) ×3 IMPLANT
SYR TB 1ML 27GX1/2 LL (SYRINGE) ×3 IMPLANT
WATER STERILE IRR 1000ML POUR (IV SOLUTION) ×3 IMPLANT
WIPE NON LINTING 3.25X3.25 (MISCELLANEOUS) ×3 IMPLANT

## 2015-12-19 NOTE — Transfer of Care (Signed)
Immediate Anesthesia Transfer of Care Note  Patient: Kenneth Armstrong  Procedure(s) Performed: Procedure(s) with comments: CATARACT EXTRACTION PHACO AND INTRAOCULAR LENS PLACEMENT (IOC) (Right) - [place] 40.0 AP% 7.6 CDE 3.03 Fluid Pack Lot # [id number]  Patient Location: Short Stay  Anesthesia Type:MAC  Level of Consciousness: awake, alert  and oriented  Airway & Oxygen Therapy: Patient Spontanous Breathing and Patient connected to nasal cannula oxygen  Post-op Assessment: Report given to RN and Post -op Vital signs reviewed and stable  Post vital signs: Reviewed and stable  Last Vitals: 1113 98.1 temp 66 hr 100% sat 135/75 bp 18 resp  Filed Vitals:   12/19/15 0923  BP: 150/80  Pulse: 63  Temp: 36.7 C  Resp: 16    Last Pain: There were no vitals filed for this visit.       Complications: No apparent anesthesia complications

## 2015-12-19 NOTE — Op Note (Signed)
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 Item Serial No. Manufacturer Lot No. LRB No. Used  LENS IOL TECNIS 17.5 - [id number] Intraocular Lens LENS IOL TECNIS 17.5 [id number]   Right 1       ULTRASOUND TIME: 8 %  of 0 minutes 40 seconds, CDE 3.0  SURGEON:  Wyonia Hough, MD   ANESTHESIA:  Topical with tetracaine drops and 2% Xylocaine jelly, augmented with 1% preservative-free intracameral lidocaine.    COMPLICATIONS:  None.   DESCRIPTION OF PROCEDURE:  The patient was identified in the holding room and transported to the operating room and placed in the supine position under the operating microscope. Theright eye was identified as the operative eye and it was prepped and draped in the usual sterile ophthalmic fashion.   A 1 millimeter clear-corneal paracentesis was made at the 12:00 position.  0.5 ml of preservative-free 1% lidocaine was injected into the anterior chamber. The anterior chamber was filled with Viscoat viscoelastic.  A 2.4 millimeter keratome was used to make a near-clear corneal incision at the 9:00 position. A curvilinear capsulorrhexis was made with a cystotome and capsulorrhexis forceps.  Balanced salt solution was used to hydrodissect and hydrodelineate the nucleus.   Phacoemulsification was then used in stop and chop fashion to remove the lens nucleus and epinucleus.  The remaining cortex was then removed using the irrigation and aspiration handpiece. Provisc was then placed into the capsular bag to distend it for lens placement.  A lens was then injected into the capsular bag.  The remaining viscoelastic was aspirated.  Wounds were hydrated with balanced salt  solution.  The anterior chamber was inflated to a physiologic pressure with balanced salt solution. Cefuroxime 0.1 ml of a 10mg /ml solution was injected into the anterior chamber for a dose of 1 mg of intracameral antibiotic at the completion of the case. Miostat was placed into the anterior chamber to constrict the pupil.  No wound leaks were noted.  Topical Vigamox drops and Maxitrol ointment were applied to the eye.  The patient was taken to the recovery room in stable condition without complications of anesthesia or surgery.  [name] 12/19/2015, 11:10 AM

## 2015-12-19 NOTE — Anesthesia Preprocedure Evaluation (Signed)
Anesthesia Evaluation  Patient identified by MRN, date of birth, ID band Patient awake    Reviewed: Allergy & Precautions, H&P , NPO status , Patient's Chart, lab work & pertinent test results, reviewed documented beta blocker date and time   Airway Mallampati: II  TM Distance: >3 FB Neck ROM: full    Dental no notable dental hx. (+) Teeth Intact   Pulmonary neg shortness of breath, neg COPD, Recent URI , Resolved, former smoker,    Pulmonary exam normal breath sounds clear to auscultation       Cardiovascular Exercise Tolerance: Good (-) hypertension(-) angina(-) CAD, (-) Past MI, (-) Cardiac Stents and (-) CABG Normal cardiovascular exam+ dysrhythmias (-) Valvular Problems/Murmurs Rhythm:regular Rate:Normal     Neuro/Psych negative neurological ROS  negative psych ROS   GI/Hepatic Neg liver ROS, hiatal hernia, GERD  Medicated,  Endo/Other  negative endocrine ROS  Renal/GU negative Renal ROS  negative genitourinary   Musculoskeletal   Abdominal   Peds  Hematology negative hematology ROS (+)   Anesthesia Other Findings Past Medical History:   GERD (gastroesophageal reflux disease)                       Hx of Lyme disease                                           Hx of hiatal hernia                                          Acid reflux                                                  Dysrhythmia                                                  History of hiatal hernia                                     Arthritis                                                    Palpitations                                                 Edema  Comment:LEGS/FEET   Reproductive/Obstetrics negative OB ROS                             Anesthesia Physical Anesthesia Plan  ASA: II  Anesthesia Plan: MAC   Post-op Pain Management:    Induction:    Airway Management Planned:   Additional Equipment:   Intra-op Plan:   Post-operative Plan:   Informed Consent: I have reviewed the patients History and Physical, chart, labs and discussed the procedure including the risks, benefits and alternatives for the proposed anesthesia with the patient or authorized representative who has indicated his/her understanding and acceptance.   Dental Advisory Given  Plan Discussed with: Anesthesiologist, CRNA and Surgeon  Anesthesia Plan Comments:         Anesthesia Quick Evaluation

## 2015-12-19 NOTE — Discharge Instructions (Signed)
AMBULATORY SURGERY  DISCHARGE INSTRUCTIONS   1) The drugs that you were given will stay in your system until tomorrow so for the next 24 hours you should not:  A) Drive an automobile B) Make any legal decisions C) Drink any alcoholic beverage   2) You may resume regular meals tomorrow.  Today it is better to start with liquids and gradually work up to solid foods.  You may eat anything you prefer, but it is better to start with liquids, then soup and crackers, and gradually work up to solid foods.   3) Please notify your doctor immediately if you have any unusual bleeding, trouble breathing, redness and pain at the surgery site, drainage, fever, or pain not relieved by medication.    4) Additional Instructions:        Please contact your physician with any problems or Same Day Surgery at [phone], Monday through Friday 6 am to 4 pm, or [place] at [facility] number at [phone].   Eye Surgery Discharge Instructions  Expect mild scratchy sensation or mild soreness. DO NOT RUB YOUR EYE!  The day of surgery:  Minimal physical activity, but bed rest is not required  No reading, computer work, or close hand work  No bending, lifting, or straining.  May watch TV  For 24 hours:  No driving, legal decisions, or alcoholic beverages  Safety precautions  Eat anything you prefer: It is better to start with liquids, then soup then solid foods.  _____ Eye patch should be worn until postoperative exam tomorrow.  ____ Solar shield eyeglasses should be worn for comfort in the sunlight/patch while sleeping  Resume all regular medications including aspirin or Coumadin if these were discontinued prior to surgery. You may shower, bathe, shave, or wash your hair. Tylenol may be taken for mild discomfort.  Call your doctor if you experience significant pain, nausea, or vomiting, fever > 101 or other signs of infection. [phone] or [phone] Specific  instructions:  Follow-up Information    Follow up with Leandrew Koyanagi, MD In 1 day.   Specialty:  Ophthalmology   Why:  April 28 at 11:05am   Contact information:   [address]   [place] [address] [phone]

## 2015-12-19 NOTE — Anesthesia Postprocedure Evaluation (Signed)
Anesthesia Post Note  Patient: Kenneth Armstrong  Procedure(s) Performed: Procedure(s) (LRB): CATARACT EXTRACTION PHACO AND INTRAOCULAR LENS PLACEMENT (IOC) (Right)  Patient location during evaluation: Short Stay Anesthesia Type: MAC Level of consciousness: awake and alert and oriented Pain management: pain level controlled Vital Signs Assessment: post-procedure vital signs reviewed and stable Respiratory status: spontaneous breathing Cardiovascular status: stable Postop Assessment: no signs of nausea or vomiting Anesthetic complications: no    Last Vitals:  Filed Vitals:   12/19/15 0923  BP: 150/80  Pulse: 63  Temp: 36.7 C  Resp: 16    Last Pain: There were no vitals filed for this visit.               Delaney Meigs

## 2015-12-19 NOTE — H&P (Signed)
  The History and Physical notes are on paper, have been signed, and are to be scanned. The patient remains stable and unchanged from the H&P.   Previous H&P reviewed, patient examined, and there are no changes.  Kenneth Armstrong 12/19/2015 9:46 AM

## 2016-01-15 ENCOUNTER — Encounter: Payer: Self-pay | Admitting: Ophthalmology

## 2016-02-10 DIAGNOSIS — R3915 Urgency of urination: Secondary | ICD-10-CM | POA: Diagnosis not present

## 2016-02-10 DIAGNOSIS — R3912 Poor urinary stream: Secondary | ICD-10-CM | POA: Diagnosis not present

## 2016-02-10 DIAGNOSIS — N401 Enlarged prostate with lower urinary tract symptoms: Secondary | ICD-10-CM | POA: Diagnosis not present

## 2016-05-21 DIAGNOSIS — Z6826 Body mass index (BMI) 26.0-26.9, adult: Secondary | ICD-10-CM | POA: Diagnosis not present

## 2016-05-21 DIAGNOSIS — Z8249 Family history of ischemic heart disease and other diseases of the circulatory system: Secondary | ICD-10-CM | POA: Diagnosis not present

## 2016-05-21 DIAGNOSIS — Z125 Encounter for screening for malignant neoplasm of prostate: Secondary | ICD-10-CM | POA: Diagnosis not present

## 2016-05-21 DIAGNOSIS — Z23 Encounter for immunization: Secondary | ICD-10-CM | POA: Diagnosis not present

## 2016-05-21 DIAGNOSIS — K635 Polyp of colon: Secondary | ICD-10-CM | POA: Diagnosis not present

## 2016-05-21 DIAGNOSIS — K219 Gastro-esophageal reflux disease without esophagitis: Secondary | ICD-10-CM | POA: Diagnosis not present

## 2016-05-21 DIAGNOSIS — N400 Enlarged prostate without lower urinary tract symptoms: Secondary | ICD-10-CM | POA: Diagnosis not present

## 2016-05-26 DIAGNOSIS — N401 Enlarged prostate with lower urinary tract symptoms: Secondary | ICD-10-CM | POA: Diagnosis not present

## 2016-05-26 DIAGNOSIS — R3912 Poor urinary stream: Secondary | ICD-10-CM | POA: Diagnosis not present

## 2016-05-27 ENCOUNTER — Other Ambulatory Visit: Payer: Self-pay | Admitting: Urology

## 2016-06-02 ENCOUNTER — Encounter (HOSPITAL_BASED_OUTPATIENT_CLINIC_OR_DEPARTMENT_OTHER): Payer: Self-pay | Admitting: *Deleted

## 2016-06-02 NOTE — Progress Notes (Signed)
NPO AFTER MN.  ARRIVE AT 0745.  NEEDS HG.  WILL TAKE PRILOSEC  AM DOS W/ SIPS OF WATER.

## 2016-06-04 NOTE — H&P (Signed)
Office Visit Report     05/26/2016   --------------------------------------------------------------------------------   Kenneth Armstrong  MRN: [id number]  PRIMARY CARE:  [facility]  DOB: 08-02-1941, 75 year old Male  REFERRING:  Cyndi Bender, [place]  SSN: -[id number]  PROVIDER:  Festus Aloe, M.D.    LOCATION:  [facility] [phone]   --------------------------------------------------------------------------------   CC: I have symptoms of an enlarged prostate.  HPI: Kenneth Armstrong is a 75 year-old male established patient who is here for symptoms of enlarged prostate.  He first noticed the symptoms approximately 05/24/2013. His symptoms have gotten worse over the last year. He has been treated with Flomax.   C/o freq, urgency, intermittent flow, weak stream. His PSA was "less than one". No FH PCa. Significant side effects with tamsulosin (dizzy). Looking back he recalls an episode of retention after hernia repair about 30 yrs ago and a bladder/blood infection about 20 yrs ago.   His prostate u/s today shows a 64 g prostate. He stopped finasteride. He takes tamsulosin about every 4 days and has an "amazing" improvement in his flow but he's dizzy, sob, no endurance.     ALLERGIES: No Allergies    MEDICATIONS: Omeprazole 40 mg capsule,delayed release  Tamsulosin Hcl  Coq10  Fish Oil  Ibuprofen 600 mg tablet  Multivitamins  Vitamin D     GU PSH: No GU PSH    NON-GU PSH: Cataract Surgery, Right Inguinal Hernia Repair > 5 yrs, Right    GU PMH: BPH w/LUTS - 02/10/2016 Urinary Urgency - 02/10/2016 Weak Urinary Stream - 02/10/2016    NON-GU PMH: Encounter for general adult medical examination without abnormal findings, Encounter for preventive health examination Personal history of other diseases of the digestive system Personal history of other diseases of the musculoskeletal system and connective tissue    FAMILY HISTORY: Brain Cancer -  Father Colon Cancer - Brother H/O heart bypass surgery - Father Lung Cancer - Father Nonfunctioning Kidney - Son   SOCIAL HISTORY: Marital Status: Married Current Smoking Status: Patient does not smoke anymore.  Does not use smokeless tobacco. Drinks 2 caffeinated drinks per day.    REVIEW OF SYSTEMS:    GU Review Male:   Patient reports frequent urination, hard to postpone urination, get up at night to urinate, leakage of urine, and stream starts and stops. Patient denies burning/ pain with urination, trouble starting your stream, have to strain to urinate , erection problems, and penile pain.  Gastrointestinal (Upper):   Patient denies nausea, vomiting, and indigestion/ heartburn.  Gastrointestinal (Lower):   Patient denies diarrhea and constipation.  Constitutional:   Patient denies fever, night sweats, weight loss, and fatigue.  Skin:   Patient denies skin rash/ lesion and itching.  Eyes:   Patient denies blurred vision and double vision.  Ears/ Nose/ Throat:   Patient denies sore throat and sinus problems.  Hematologic/Lymphatic:   Patient denies swollen glands and easy bruising.  Cardiovascular:   Patient denies leg swelling and chest pains.  Respiratory:   Patient denies cough and shortness of breath.  Endocrine:   Patient denies excessive thirst.  Musculoskeletal:   Patient denies joint pain and back pain.  Neurological:   Patient denies headaches and dizziness.  Psychologic:   Patient denies depression and anxiety.   VITAL SIGNS:      05/26/2016 03:23 PM  BP 162/81 mmHg  Pulse 52 /min  Temperature 98.0 F / 37 C   MULTI-SYSTEM PHYSICAL EXAMINATION:  Constitutional: Well-nourished. No physical deformities. Normally developed. Good grooming.  Neck: Neck symmetrical, not swollen. Normal tracheal position.  Respiratory: No labored breathing, no use of accessory muscles.   Cardiovascular: Normal temperature, normal extremity pulses, no swelling, no varicosities.  Skin: No  paleness, no jaundice, no cyanosis. No lesion, no ulcer, no rash.  Neurologic / Psychiatric: Oriented to time, oriented to place, oriented to person. No depression, no anxiety, no agitation.     PAST DATA REVIEWED:  Source Of History:  Patient   PROCEDURES:         Flexible Cystoscopy - 52000  Risks, benefits, and some of the potential complications of the procedure were discussed with the patient. All questions were answered. Informed consent was obtained. Antibiotic prophylaxis was given -- Cipro. Sterile technique and intraurethral analgesia were used.  Meatus:  Normal size. Normal location. Normal condition.  Urethra:  No strictures.  External Sphincter:  Normal.  Verumontanum:  Normal.  Prostate:  Obstructing. Moderate hyperplasia.  Bladder Neck:  Non-obstructing.  Ureteral Orifices:  Normal location. Normal size. Normal shape. Effluxed clear urine.  Bladder:  No trabeculation. No tumors. Normal mucosa. No stones.      The lower urinary tract was carefully examined. The procedure was well-tolerated and without complications. Antibiotic instructions were given. Instructions were given to call the office immediately for bloody urine, difficulty urinating, painful urination, fever, chills, nausea, vomiting or other illness. The patient stated that he understood these instructions and would comply with them.         Prostate Ultrasound - [id number]  Length:5.09cm Height:4.05cm Width:5.97cm Volume:64.3ml  Prostate:  ? Hypoechoic area right prostate      The transrectal ultrasound probe is introduced into the rectum, and the prostate is visualized. Ultrasonography is utilized throughout the procedure. At the conclusion of the procedure, the ultrasound probe is removed. The patient tolerates the procedure without complication.          PVR Ultrasound - [id number]  Scanned Volume: 176.83 cc         Urinalysis - 81003 Dipstick Dipstick Cont'd  Specimen: Voided Bilirubin: Neg  Color:  Yellow Ketones: Neg  Appearance: Clear Blood: Neg  Specific Gravity: 1.025 Protein: Neg  pH: 6.0 Urobilinogen: 0.2  Glucose: Neg Nitrites: Neg    Leukocyte Esterase: Neg    ASSESSMENT:      ICD-10 Details  1 GU:   Weak Urinary Stream - R39.12   2   BPH w/LUTS - N40.1    PLAN:            Medications Stop Meds: Finasteride 5 mg tablet  Discontinue: 05/26/2016  - Reason: The patient suffered unacceptable side effects.            Schedule Return Visit: Next Available Appointment - Schedule Surgery          Document Letter(s):  Created for Patient: Clinical Summary         Notes:   BPH with wk stream, inc emptying and h/o retention and UTI - discussed nature r/b/a to KTP laser prostate including risk of stricture, bleeding, incontinence, ED among others. Discussed possible need for staged procedure and conversion to TURP. He has done well with alpha blockers but wants to get off. Discussed flow symptoms usually improve as well as frequency, urgency although sometimes frequency, urgency can stay the same and rarely worsen.   cc; PA Tobie Lords.     * Signed by Festus Aloe, M.D. on 05/26/16 at 4:32 PM (EDT)*  The information contained in this medical record document is considered private and confidential patient information. This information can only be used for the medical diagnosis and/or medical services that are being provided by the patient's selected caregivers. This information can only be distributed outside of the patient's care if the patient agrees and signs waivers of authorization for this information to be sent to an outside source or route.

## 2016-06-05 ENCOUNTER — Encounter (HOSPITAL_BASED_OUTPATIENT_CLINIC_OR_DEPARTMENT_OTHER): Payer: Self-pay | Admitting: Anesthesiology

## 2016-06-05 ENCOUNTER — Ambulatory Visit (HOSPITAL_BASED_OUTPATIENT_CLINIC_OR_DEPARTMENT_OTHER): Payer: Medicare Other | Admitting: Anesthesiology

## 2016-06-05 ENCOUNTER — Encounter (HOSPITAL_BASED_OUTPATIENT_CLINIC_OR_DEPARTMENT_OTHER)
Admission: RE | Disposition: A | Discharge status: Home / Self Care | Payer: Self-pay | Source: Ambulatory Visit | Attending: Urology

## 2016-06-05 ENCOUNTER — Ambulatory Visit (HOSPITAL_BASED_OUTPATIENT_CLINIC_OR_DEPARTMENT_OTHER)
Admission: RE | Admit: 2016-06-05 | Discharge: 2016-06-05 | Disposition: A | Discharge status: Home / Self Care | Payer: Medicare Other | Source: Ambulatory Visit | Attending: Urology | Admitting: Urology

## 2016-06-05 DIAGNOSIS — R3912 Poor urinary stream: Secondary | ICD-10-CM | POA: Insufficient documentation

## 2016-06-05 DIAGNOSIS — Z87891 Personal history of nicotine dependence: Secondary | ICD-10-CM | POA: Insufficient documentation

## 2016-06-05 DIAGNOSIS — K219 Gastro-esophageal reflux disease without esophagitis: Secondary | ICD-10-CM | POA: Insufficient documentation

## 2016-06-05 DIAGNOSIS — R3915 Urgency of urination: Secondary | ICD-10-CM | POA: Diagnosis not present

## 2016-06-05 DIAGNOSIS — N401 Enlarged prostate with lower urinary tract symptoms: Secondary | ICD-10-CM | POA: Insufficient documentation

## 2016-06-05 DIAGNOSIS — N400 Enlarged prostate without lower urinary tract symptoms: Secondary | ICD-10-CM | POA: Diagnosis not present

## 2016-06-05 HISTORY — DX: Diaphragmatic hernia without obstruction or gangrene: K44.9

## 2016-06-05 HISTORY — DX: Unspecified symptoms and signs involving the genitourinary system: R39.9

## 2016-06-05 HISTORY — DX: Presence of spectacles and contact lenses: Z97.3

## 2016-06-05 HISTORY — DX: Personal history of other specified conditions: Z87.898

## 2016-06-05 HISTORY — DX: Benign prostatic hyperplasia without lower urinary tract symptoms: N40.0

## 2016-06-05 LAB — POCT HEMOGLOBIN-HEMACUE: Hemoglobin: 15.6 g/dL (ref 13.0–17.0)

## 2016-06-05 SURGERY — THULIUM LASER TURP (TRANSURETHRAL RESECTION OF PROSTATE)
Anesthesia: General | Site: Prostate

## 2016-06-05 MED ORDER — BELLADONNA ALKALOIDS-OPIUM 16.2-60 MG RE SUPP
RECTAL | Status: DC | PRN
  Administered 2016-06-05: 1 via RECTAL

## 2016-06-05 MED ORDER — LIDOCAINE 2% (20 MG/ML) 5 ML SYRINGE
INTRAMUSCULAR | Status: DC | PRN
  Administered 2016-06-05: 80 mg via INTRAVENOUS

## 2016-06-05 MED ORDER — CEFAZOLIN SODIUM-DEXTROSE 2-4 GM/100ML-% IV SOLN
2.0000 g | INTRAVENOUS | Status: AC
  Administered 2016-06-05: 2 g via INTRAVENOUS
  Filled 2016-06-05: qty 100

## 2016-06-05 MED ORDER — ROCURONIUM BROMIDE 50 MG/5ML IV SOSY
PREFILLED_SYRINGE | INTRAVENOUS | Status: AC
  Filled 2016-06-05: qty 5

## 2016-06-05 MED ORDER — FENTANYL CITRATE (PF) 100 MCG/2ML IJ SOLN
25.0000 ug | INTRAMUSCULAR | Status: DC | PRN
  Filled 2016-06-05: qty 1

## 2016-06-05 MED ORDER — CEPHALEXIN 500 MG PO CAPS: 500.0000 mg | ORAL_CAPSULE | Freq: Every day | ORAL | 0 refills | Status: DC

## 2016-06-05 MED ORDER — MEPERIDINE HCL 25 MG/ML IJ SOLN
6.2500 mg | INTRAMUSCULAR | Status: DC | PRN
  Filled 2016-06-05: qty 1

## 2016-06-05 MED ORDER — SUCCINYLCHOLINE CHLORIDE 200 MG/10ML IV SOSY
PREFILLED_SYRINGE | INTRAVENOUS | Status: DC | PRN
  Administered 2016-06-05: 120 mg via INTRAVENOUS

## 2016-06-05 MED ORDER — LIDOCAINE 2% (20 MG/ML) 5 ML SYRINGE
INTRAMUSCULAR | Status: AC
  Filled 2016-06-05: qty 5

## 2016-06-05 MED ORDER — CEFAZOLIN IN D5W 1 GM/50ML IV SOLN
1.0000 g | INTRAVENOUS | Status: DC
  Filled 2016-06-05: qty 50

## 2016-06-05 MED ORDER — KETOROLAC TROMETHAMINE 30 MG/ML IJ SOLN
INTRAMUSCULAR | Status: DC | PRN
  Administered 2016-06-05: 30 mg via INTRAVENOUS

## 2016-06-05 MED ORDER — SUCCINYLCHOLINE CHLORIDE 20 MG/ML IJ SOLN
INTRAMUSCULAR | Status: AC
  Filled 2016-06-05: qty 1

## 2016-06-05 MED ORDER — TRAMADOL HCL 50 MG PO TABS: 50.0000 mg | ORAL_TABLET | Freq: Four times a day (QID) | ORAL | 0 refills | Status: DC | PRN

## 2016-06-05 MED ORDER — KETOROLAC TROMETHAMINE 30 MG/ML IJ SOLN
INTRAMUSCULAR | Status: AC
  Filled 2016-06-05: qty 1

## 2016-06-05 MED ORDER — PROPOFOL 10 MG/ML IV BOLUS
INTRAVENOUS | Status: DC | PRN
Start: 2016-06-05 — End: 2016-06-05
  Administered 2016-06-05: 200 mg via INTRAVENOUS
  Administered 2016-06-05: 20 mg via INTRAVENOUS

## 2016-06-05 MED ORDER — FENTANYL CITRATE (PF) 100 MCG/2ML IJ SOLN
INTRAMUSCULAR | Status: DC | PRN
  Administered 2016-06-05 (×2): 50 ug via INTRAVENOUS

## 2016-06-05 MED ORDER — ONDANSETRON HCL 4 MG/2ML IJ SOLN
INTRAMUSCULAR | Status: DC | PRN
  Administered 2016-06-05: 4 mg via INTRAVENOUS

## 2016-06-05 MED ORDER — LACTATED RINGERS IV SOLN
INTRAVENOUS | Status: DC
  Administered 2016-06-05 (×2): via INTRAVENOUS
  Filled 2016-06-05: qty 1000

## 2016-06-05 MED ORDER — SOD CITRATE-CITRIC ACID 500-334 MG/5ML PO SOLN
30.0000 mL | Freq: Once | ORAL | Status: AC
  Administered 2016-06-05: 30 mL via ORAL
  Filled 2016-06-05 (×2): qty 30

## 2016-06-05 MED ORDER — SODIUM CHLORIDE 0.9 % IR SOLN
Status: DC | PRN
  Administered 2016-06-05: 13000 mL

## 2016-06-05 MED ORDER — FENTANYL CITRATE (PF) 100 MCG/2ML IJ SOLN
INTRAMUSCULAR | Status: AC
  Filled 2016-06-05: qty 2

## 2016-06-05 MED ORDER — EPHEDRINE SULFATE-NACL 50-0.9 MG/10ML-% IV SOSY
PREFILLED_SYRINGE | INTRAVENOUS | Status: DC | PRN
  Administered 2016-06-05 (×3): 10 mg via INTRAVENOUS

## 2016-06-05 MED ORDER — BELLADONNA ALKALOIDS-OPIUM 16.2-60 MG RE SUPP
RECTAL | Status: AC
  Filled 2016-06-05: qty 1

## 2016-06-05 MED ORDER — METOCLOPRAMIDE HCL 5 MG/ML IJ SOLN
10.0000 mg | Freq: Once | INTRAMUSCULAR | Status: DC | PRN
  Filled 2016-06-05: qty 2

## 2016-06-05 MED ORDER — DEXAMETHASONE SODIUM PHOSPHATE 4 MG/ML IJ SOLN
INTRAMUSCULAR | Status: DC | PRN
  Administered 2016-06-05: 10 mg via INTRAVENOUS

## 2016-06-05 MED ORDER — METOCLOPRAMIDE HCL 5 MG/ML IJ SOLN
INTRAMUSCULAR | Status: AC
  Filled 2016-06-05: qty 2

## 2016-06-05 MED ORDER — METOCLOPRAMIDE HCL 5 MG/ML IJ SOLN
INTRAMUSCULAR | Status: DC | PRN
  Administered 2016-06-05: 10 mg via INTRAVENOUS

## 2016-06-05 MED ORDER — PROPOFOL 10 MG/ML IV BOLUS
INTRAVENOUS | Status: AC
  Filled 2016-06-05: qty 40

## 2016-06-05 MED ORDER — CEFAZOLIN SODIUM-DEXTROSE 2-4 GM/100ML-% IV SOLN
INTRAVENOUS | Status: AC
  Filled 2016-06-05: qty 100

## 2016-06-05 MED ORDER — METOPROLOL TARTRATE 5 MG/5ML IV SOLN
INTRAVENOUS | Status: AC
  Filled 2016-06-05: qty 5

## 2016-06-05 MED ORDER — ARTIFICIAL TEARS OP OINT
TOPICAL_OINTMENT | OPHTHALMIC | Status: AC
  Filled 2016-06-05: qty 3.5

## 2016-06-05 MED ORDER — EPHEDRINE 5 MG/ML INJ
INTRAVENOUS | Status: AC
  Filled 2016-06-05: qty 10

## 2016-06-05 SURGICAL SUPPLY — 36 items
BAG DRAIN URO-CYSTO SKYTR STRL (DRAIN) ×3 IMPLANT
BAG DRN UROCATH (DRAIN) ×1
BAG URINE DRAINAGE (UROLOGICAL SUPPLIES) ×5 IMPLANT
CATH COUDE FOLEY 2W 5CC 18FR (CATHETERS) ×2 IMPLANT
CATH FOLEY 2WAY SLVR  5CC 18FR (CATHETERS)
CATH FOLEY 2WAY SLVR 5CC 18FR (CATHETERS) IMPLANT
CATH FOLEY 3WAY 30CC 22F (CATHETERS) IMPLANT
CLOTH BEACON ORANGE TIMEOUT ST (SAFETY) ×3 IMPLANT
ELECT BIVAP BIPO 22/24 DONUT (ELECTROSURGICAL)
ELECT LOOP MED HF 24F 12D (CUTTING LOOP) IMPLANT
ELECTRD BIVAP BIPO 22/24 DONUT (ELECTROSURGICAL) IMPLANT
FEE RENTAL LASER GREENLIGHT (Laser) ×1 IMPLANT
GLOVE BIO SURGEON STRL SZ7.5 (GLOVE) ×3 IMPLANT
GLOVE BIOGEL PI IND STRL 7.0 (GLOVE) IMPLANT
GLOVE BIOGEL PI INDICATOR 7.0 (GLOVE) ×2
GLOVE ECLIPSE 7.0 STRL STRAW (GLOVE) ×2 IMPLANT
GOWN STRL REUS W/ TWL XL LVL3 (GOWN DISPOSABLE) ×1 IMPLANT
GOWN STRL REUS W/TWL LRG LVL3 (GOWN DISPOSABLE) ×2 IMPLANT
GOWN STRL REUS W/TWL XL LVL3 (GOWN DISPOSABLE) ×3
HOLDER FOLEY CATH W/STRAP (MISCELLANEOUS) ×4 IMPLANT
IV NS 1000ML (IV SOLUTION)
IV NS 1000ML BAXH (IV SOLUTION) ×1 IMPLANT
IV NS IRRIG 3000ML ARTHROMATIC (IV SOLUTION) ×9 IMPLANT
IV SET EXTENSION GRAVITY 40 LF (IV SETS) ×1 IMPLANT
KIT ROOM TURNOVER WOR (KITS) ×3 IMPLANT
LASER FIBER /GREENLIGHT LASER (Laser) ×1 IMPLANT
LASER FIBER THULIUM ×1 IMPLANT
LASER GREENLIGHT RENTAL P/PROC (Laser) IMPLANT
LASER/ THULIUM ×2 IMPLANT
LOOP CUT BIPOLAR 24F LRG (ELECTROSURGICAL) IMPLANT
MANIFOLD NEPTUNE II (INSTRUMENTS) ×2 IMPLANT
PACK CYSTO (CUSTOM PROCEDURE TRAY) ×3 IMPLANT
SYR 30ML LL (SYRINGE) IMPLANT
SYRINGE IRR TOOMEY STRL 70CC (SYRINGE) IMPLANT
TUBE CONNECTING 12'X1/4 (SUCTIONS)
TUBE CONNECTING 12X1/4 (SUCTIONS) IMPLANT

## 2016-06-05 NOTE — Anesthesia Preprocedure Evaluation (Addendum)
Anesthesia Evaluation  Patient identified by MRN, date of birth, ID band Patient awake    Reviewed: Allergy & Precautions, NPO status , Patient's Chart, lab work & pertinent test results  Airway Mallampati: II  TM Distance: >3 FB Neck ROM: Full    Dental no notable dental hx.    Pulmonary neg pulmonary ROS, former smoker,    Pulmonary exam normal breath sounds clear to auscultation       Cardiovascular negative cardio ROS Normal cardiovascular exam Rhythm:Regular Rate:Normal     Neuro/Psych negative neurological ROS  negative psych ROS   GI/Hepatic Neg liver ROS, GERD  Medicated and Controlled,  Endo/Other  negative endocrine ROS  Renal/GU negative Renal ROS  negative genitourinary   Musculoskeletal negative musculoskeletal ROS (+)   Abdominal   Peds negative pediatric ROS (+)  Hematology negative hematology ROS (+)   Anesthesia Other Findings   Reproductive/Obstetrics negative OB ROS                             Anesthesia Physical Anesthesia Plan  ASA: II  Anesthesia Plan: General   Post-op Pain Management:    Induction: Intravenous  Airway Management Planned: Oral ETT  Additional Equipment:   Intra-op Plan:   Post-operative Plan: Extubation in OR  Informed Consent: I have reviewed the patients History and Physical, chart, labs and discussed the procedure including the risks, benefits and alternatives for the proposed anesthesia with the patient or authorized representative who has indicated his/her understanding and acceptance.   Dental advisory given  Plan Discussed with: CRNA  Anesthesia Plan Comments: (Pt c/o reflux this AM despite prilosec. Will pre treat with bicitra and intubate)       Anesthesia Quick Evaluation

## 2016-06-05 NOTE — Op Note (Signed)
Preoperative diagnosis: BPH, weak stream Postoperative diagnosis: Same  Procedure: Thulium laser vaporization of the prostate  Surgeon: Junious Silk  Anesthesia: Gen.  Indication for procedure: 75 year old with BPH and symptomatic lower urinary tract symptoms with excellent relief without for blockers but bothersome side effects. He elected for surgical procedure for BPH. His post void residual was slightly elevated.  Findings: On exam under anesthesia the penis was circumcised without mass or lesion. The testicles were descended bilaterally and palpably normal. On digital rectal exam the prostate was smooth without hard area or nodule.  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 The prostatic urethra had obstructing lateral lobes and a small median lobe. There was some clearance of the median lobe down near the veru and a small veru  and I wonder if he's had prior procedure such as a microwave. The trigone and ureteral orifices were in their normal orthotopic position with clear efflux. There were no stones or foreign bodies in the bladder. There was good bladder capacity. The mucosa the bladder was normal. There was moderate trabeculation.   Description of procedure: After consent was obtained patient brought to the operating room. After adequate anesthesia he is placed in lithotomy position and prepped and draped in the usual sterile fashion. I did an exam under anesthesia after a timeout was done to confirm the patient and procedure. I placed a B&O suppository. The 22 French continuous flow laser scope initially would not pass through the meatus. I dilated the meatus and penile urethra from 22-26 [place]. It was quite tight. The cystoscope was passed per urethra and the bladder carefully inspected. I then took an 800  laser fiber and and and energy of 100 made incisions at 5:00 and 7:00 in line with the ureteral orifices down to the apex. This  incision was taken down to the bladder neck just above the level of the trigone. I then vaporized some median lobe tissue. I then started at the bladder neck and worked my way down to the apex and backup and vaporize the left lateral lobe and the right lateral lobe. The bladder was emptied and I vaporized some residual lateral lobe tissue. This created an excellent channel with free movement of the scope. I did leave some apical tissue and did not need to vaporize anteriorly. Hemostasis was excellent at low-pressure. The bladder was filled and the scope removed. An 18 coud catheter was advanced was 17 mL in the balloon and seated at the bladder neck. Irrigation was clear. He was awakened and taken to the recovery room in stable condition.   Complications: None  Blood loss: Minimal  Specimens: None   Drains: 55 French coud catheter   Disposition: Patient stable to PACU

## 2016-06-05 NOTE — Interval H&P Note (Signed)
History and Physical Interval Note:  06/05/2016 9:52 AM  Kenneth Armstrong  has presented today for surgery, with the diagnosis of BPH  The various methods of treatment have been discussed with the patient and family I discussed with the patient the nature, potential benefits, risks and alternatives to laser PVP, including side effects of the proposed treatment, the likelihood of the patient achieving the goals of the procedure, and any potential problems that might occur during the procedure or recuperation. All questions answered. After consideration of risks, benefits and other options for treatment, the patient has consented to  Procedure(s): THULIUM LASER TURP (TRANSURETHRAL RESECTION OF PROSTATE) (N/A) as a surgical intervention .  The patient's history has been reviewed, patient examined, no change in status, stable for surgery.  I have reviewed the patient's chart and labs.  Questions were answered to the patient's satisfaction.  He elects to proceed. He has been well with no dysuria or gross hematuria.    Clayton Bosserman

## 2016-06-05 NOTE — Transfer of Care (Addendum)
  Last Vitals:  Vitals:   06/05/16 0752 06/05/16 1138  BP: (!) 151/87 (!) 145/94  Pulse: (!) 59 74  Resp: 20 13  Temp: 36.8 C     Last Pain:  Vitals:   06/05/16 0752  TempSrc: Oral         Immediate Anesthesia Transfer of Care Note  Patient: Kenneth Armstrong  Procedure(s) Performed: Procedure(s) (LRB): THULIUM LASER TURP (TRANSURETHRAL RESECTION OF PROSTATE) (N/A)  Patient Location: PACU  Anesthesia Type: General  Level of Consciousness: awake, alert  and oriented  Airway & Oxygen Therapy: Patient Spontanous Breathing and Patient connected to face mask oxygen  Post-op Assessment: Report given to PACU RN and Post -op Vital signs reviewed and stable  Post vital signs: Reviewed and stable  Complications: No apparent anesthesia complications

## 2016-06-05 NOTE — Anesthesia Postprocedure Evaluation (Signed)
Anesthesia Post Note  Patient: Kenneth Armstrong  Procedure(s) Performed: Procedure(s) (LRB): THULIUM LASER TURP (TRANSURETHRAL RESECTION OF PROSTATE) (N/A)  Patient location during evaluation: PACU Anesthesia Type: General Level of consciousness: awake and alert Pain management: pain level controlled Vital Signs Assessment: post-procedure vital signs reviewed and stable Respiratory status: spontaneous breathing, nonlabored ventilation, respiratory function stable and patient connected to nasal cannula oxygen Cardiovascular status: blood pressure returned to baseline and stable Postop Assessment: no signs of nausea or vomiting Anesthetic complications: no    Last Vitals:  Vitals:   06/05/16 1200 06/05/16 1215  BP: (!) 145/77 136/85  Pulse: 64 63  Resp: 13 13  Temp:      Last Pain:  Vitals:   06/05/16 1200  TempSrc:   PainSc: 0-No pain                 Montez Hageman

## 2016-06-05 NOTE — Discharge Instructions (Signed)
°Post Anesthesia Home Care Instructions ° °Activity: °Get plenty of rest for the remainder of the day. A responsible adult should stay with you for 24 hours following the procedure.  °For the next 24 hours, DO NOT: °-Drive a car °-Operate machinery °-Drink alcoholic beverages °-Take any medication unless instructed by your physician °-Make any legal decisions or sign important papers. ° °Meals: °Start with liquid foods such as gelatin or soup. Progress to regular foods as tolerated. Avoid greasy, spicy, heavy foods. If nausea and/or vomiting occur, drink only clear liquids until the nausea and/or vomiting subsides. Call your physician if vomiting continues. ° °Special Instructions/Symptoms: °Your throat may feel dry or sore from the anesthesia or the breathing tube placed in your throat during surgery. If this causes discomfort, gargle with warm salt water. The discomfort should disappear within 24 hours. ° °If you had a scopolamine patch placed behind your ear for the management of post- operative nausea and/or vomiting: ° °1. The medication in the patch is effective for 72 hours, after which it should be removed.  Wrap patch in a tissue and discard in the trash. Wash hands thoroughly with soap and water. °2. You may remove the patch earlier than 72 hours if you experience unpleasant side effects which may include dry mouth, dizziness or visual disturbances. °3. Avoid touching the patch. Wash your hands with soap and water after contact with the patch. °  °Foley Catheter Care, Adult °A Foley catheter is a soft, flexible tube. This tube is placed into your bladder to drain pee (urine). If you go home with this catheter in place, follow the instructions below. °TAKING CARE OF THE CATHETER °1. Wash your hands with soap and water. °2. Put soap and water on a clean washcloth. °¨ Clean the skin where the tube goes into your body. °§ Clean away from the tube site. °§ Never wipe toward the tube. °§ Clean the area using a  circular motion. °¨ Remove all the soap. Pat the area dry with a clean towel. For males, reposition the skin that covers the end of the penis (foreskin). °3. Attach the tube to your leg with tape or a leg strap. Do not stretch the tube tight. If you are using tape, remove any stickiness left behind by past tape you used. °4. Keep the drainage bag below your hips. Keep it off the floor. °5. Check your tube during the day. Make sure it is working and draining. Make sure the tube does not curl, twist, or bend. °6. Do not pull on the tube or try to take it out. °TAKING CARE OF THE DRAINAGE BAGS °You will have a large overnight drainage bag and a small leg bag. You may wear the overnight bag any time. Never wear the small bag at night. Follow the directions below. °Emptying the Drainage Bag °Empty your drainage bag when it is  -½ full or at least 2-3 times a day. °1. Wash your hands with soap and water. °2. Keep the drainage bag below your hips. °3. Hold the dirty bag over the toilet or clean container. °4. Open the pour spout at the bottom of the bag. Empty the pee into the toilet or container. Do not let the pour spout touch anything. °5. Clean the pour spout with a gauze pad or cotton ball that has rubbing alcohol on it. °6. Close the pour spout. °7. Attach the bag to your leg with tape or a leg strap. °8. Wash your hands well. °Changing   the Drainage Bag Change your bag once a month or sooner if it starts to smell or look dirty.  1. Wash your hands with soap and water. 2. Pinch the rubber tube so that pee does not spill out. 3. Disconnect the catheter tube from the drainage tube at the connection valve. Do not let the tubes touch anything. 4. Clean the end of the catheter tube with an alcohol wipe. Clean the end of a the drainage tube with a different alcohol wipe. 5. Connect the catheter tube to the drainage tube of the clean drainage bag. 6. Attach the new bag to the leg with tape or a leg strap. Avoid  attaching the new bag too tightly. 7. Wash your hands well. Cleaning the Drainage Bag 1. Wash your hands with soap and water. 2. Wash the bag in warm, soapy water. 3. Rinse the bag with warm water. 4. Fill the bag with a mixture of white vinegar and water (1 cup vinegar to 1 quart warm water [.2 liter vinegar to 1 liter warm water]). Close the bag and soak it for 30 minutes in the solution. 5. Rinse the bag with warm water. 6. Hang the bag to dry with the pour spout open and hanging downward. 7. Store the clean bag (once it is dry) in a clean plastic bag. 8. Wash your hands well. PREVENT INFECTION  Wash your hands before and after touching your tube.  Take showers every day. Wash the skin where the tube enters your body. Do not take baths. Replace wet leg straps with dry ones, if this applies.  Do not use powders, sprays, or lotions on the genital area. Only use creams, lotions, or ointments as told by your doctor.  For females, wipe from front to back after going to the bathroom.  Drink enough fluids to keep your pee clear or pale yellow unless you are told not to have too much fluid (fluid restriction).  Do not let the drainage bag or tubing touch or lie on the floor.  Wear cotton underwear to keep the area dry. GET HELP IF:  Your pee is cloudy or smells unusually bad.  Your tube becomes clogged.  You are not draining pee into the bag or your bladder feels full.  Your tube starts to leak. GET HELP RIGHT AWAY IF:  You have pain, puffiness (swelling), redness, or yellowish-white fluid (pus) where the tube enters the body.  You have pain in the belly (abdomen), legs, lower back, or bladder.  You have a fever.  You see blood fill the tube, or your pee is pink or red.  You feel sick to your stomach (nauseous), throw up (vomit), or have chills.  Your tube gets pulled out. MAKE SURE YOU:   Understand these instructions.  Will watch your condition.  Will get help right  away if you are not doing well or get worse.    Prostate Laser Surgery, Care After Refer to this sheet in the next few weeks. These instructions provide you with information on caring for yourself after your procedure. Your health care provider may also give you more specific instructions. Your treatment has been planned according to current medical practices, but problems sometimes occur. Call your health care provider if you have any problems or questions after your procedure. WHAT TO EXPECT AFTER THE PROCEDURE  You may notice blood in your urine that could last up to 3 weeks.  After your catheter is removed, you will have burning (especially at  the tip of your penis) when you urinate, especially at the end of urination. For the first few weeks after your procedure, you will feel the need to urinate often. HOME CARE INSTRUCTIONS   Do not perform vigorous exercise, especially heavy lifting, for 1 week or as directed by your health care provider.  Avoid sexual activity for 4-6 weeks or as directed by your health care provider.  Do not ride in a car for extended periods for 1 month or as directed by your health care provider.  Do not strain to have a bowel movement. Drink a lot of fluids and and make sure you get enough fiber in your diet.  Drink enough fluids to keep your urine clear or pale yellow. SEEK IMMEDIATE MEDICAL CARE IF:   Your catheter has been removed and you are suddenly unable to urinate.  Your catheter has not been removed, and it develops a blockage.  You start to have blood clots in your urine.  The blood in your urine becomes persistent or gets thick.  Your temperature is greater than 100.64F (38.1C).  You develop chest pains.  You develop shortness of breath.  You develop leg swelling or pain. MAKE SURE YOU:  Understand these instructions.  Will watch your condition.  Will get help right away if you are not doing well or get worse.   This information  is not intended to replace advice given to you by your health care provider. Make sure you discuss any questions you have with your health care provider.

## 2016-06-05 NOTE — Anesthesia Procedure Notes (Signed)
Procedure Name: Intubation Date/Time: 06/05/2016 10:00 AM Performed by: Montez Hageman Pre-anesthesia Checklist: Patient identified, Emergency Drugs available, Suction available and Patient being monitored Patient Re-evaluated:Patient Re-evaluated prior to inductionOxygen Delivery Method: Circle system utilized Preoxygenation: Pre-oxygenation with 100% oxygen Intubation Type: IV induction, Rapid sequence and Cricoid Pressure applied Laryngoscope Size: Mac and 4 Grade View: Grade I Tube type: Oral Number of attempts: 1 Airway Equipment and Method: Stylet Placement Confirmation: ETT inserted through vocal cords under direct vision,  positive ETCO2 and breath sounds checked- equal and bilateral Secured at: 23 cm Tube secured with: Tape Dental Injury: Teeth and Oropharynx as per pre-operative assessment

## 2016-08-04 DIAGNOSIS — H2512 Age-related nuclear cataract, left eye: Secondary | ICD-10-CM | POA: Diagnosis not present

## 2020-06-05 ENCOUNTER — Telehealth: Payer: Self-pay

## 2020-06-05 NOTE — Telephone Encounter (Signed)
NOTES ON FILE FROM [facility] [phone], SENT REFERRAL TO SCEDULING

## 2020-06-13 ENCOUNTER — Other Ambulatory Visit: Payer: Self-pay

## 2020-06-14 ENCOUNTER — Encounter: Payer: Self-pay | Admitting: Cardiology

## 2020-06-14 ENCOUNTER — Other Ambulatory Visit: Payer: Self-pay

## 2020-06-14 ENCOUNTER — Ambulatory Visit (INDEPENDENT_AMBULATORY_CARE_PROVIDER_SITE_OTHER): Payer: Medicare Other

## 2020-06-14 ENCOUNTER — Ambulatory Visit (INDEPENDENT_AMBULATORY_CARE_PROVIDER_SITE_OTHER): Payer: Medicare Other | Admitting: Cardiology

## 2020-06-14 VITALS — BP 100/70 | HR 55 | Ht 72.0 in | Wt 191.0 lb

## 2020-06-14 DIAGNOSIS — E785 Hyperlipidemia, unspecified: Secondary | ICD-10-CM

## 2020-06-14 DIAGNOSIS — R0789 Other chest pain: Secondary | ICD-10-CM | POA: Insufficient documentation

## 2020-06-14 DIAGNOSIS — R42 Dizziness and giddiness: Secondary | ICD-10-CM

## 2020-06-14 DIAGNOSIS — R002 Palpitations: Secondary | ICD-10-CM | POA: Diagnosis not present

## 2020-06-14 HISTORY — DX: Hyperlipidemia, unspecified: E78.5

## 2020-06-14 HISTORY — DX: Other chest pain: R07.89

## 2020-06-14 HISTORY — DX: Dizziness and giddiness: R42

## 2020-06-14 HISTORY — DX: Palpitations: R00.2

## 2020-06-14 NOTE — Patient Instructions (Addendum)
Medication Instructions:  Your physician recommends that you continue on your current medications as directed. Please refer to the Current Medication list given to you today.  *If you need a refill on your cardiac medications before your next appointment, please call your pharmacy*   Lab Work: None ordered   Testing/Procedures: Your physician has requested that you have an echocardiogram. Echocardiography is a painless test that uses sound waves to create images of your heart. It provides your doctor with information about the size and shape of your heart and how well your heart's chambers and valves are working. This procedure takes approximately one hour. There are no restrictions for this procedure.  Your physician has recommended that you wear a 7 DAY ZIO-PATCH monitor. The Zio patch cardiac monitor continuously records heart rhythm data, this is for patients being evaluated for multiple types heart rhythms. For the first 24 hours post application, please avoid getting the Zio monitor wet in the shower or by excessive sweating during exercise. After that, feel free to carry on with regular activities. Keep soaps and lotions away from the ZIO XT Patch.  Someone will be calling you to let you know when this is mailed.  This will be mailed to you, please expect 7-10 days to receive.      Call [place] at [phone] if you have questions regarding your ZIO XT patch monitor.  Call them immediately if you see an orange light blinking on your monitor.   If your monitor falls off in less than 4 days contact our Monitor department at [phone].  If your monitor becomes loose or falls off after 4 days call [employer] at [phone] for suggestions on securing your monitor     Follow-Up: At [facility], you and your health needs are our priority.  As part of our continuing mission to provide you with exceptional heart care, we have created designated Provider  Care Teams.  These Care Teams include your primary Cardiologist (physician) and Advanced Practice Providers (APPs -  Physician Assistants and Nurse Practitioners) who all work together to provide you with the care you need, when you need it.  Your next appointment:   6 week(s)  The format for your next appointment:   In Person  Provider:   Jenne Campus, MD   Thank you for choosing [facility]!!     Other Instructions  ZIO XT- Long Term Monitor Instructions   Your physician has requested you wear your ZIO patch monitor_______days.   This is a single patch monitor.  [employer] supplies one patch monitor per enrollment.  Additional stickers are not available.   Please do not apply patch if you will be having a Nuclear Stress Test, Echocardiogram, Cardiac CT, MRI, or Chest Xray during the time frame you would be wearing the monitor. The patch cannot be worn during these tests.  You cannot remove and re-apply the ZIO XT patch monitor.   Your ZIO patch monitor will be sent USPS Priority mail from [facility] directly to your home address. The monitor may also be mailed to a PO BOX if home delivery is not available.   It may take 3-5 days to receive your monitor after you have been enrolled.   Once you have received you monitor, please review enclosed instructions.  Your monitor has already been registered assigning a specific monitor serial # to you.   Applying the monitor   Shave hair from upper left chest.   Hold abrader disc by  orange tab.  Rub abrader in 40 strokes over left upper chest as indicated in your monitor instructions.   Clean area with 4 enclosed alcohol pads .  Use all pads to assure are is cleaned thoroughly.  Let dry.   Apply patch as indicated in monitor instructions.  Patch will be place under collarbone on left side of chest with arrow pointing upward.   Rub patch adhesive wings for 2 minutes.Remove white label marked "1".  Remove white label  marked "2".  Rub patch adhesive wings for 2 additional minutes.   While looking in a mirror, press and release button in center of patch.  A small green light will flash 3-4 times .  This will be your only indicator the monitor has been turned on.     Do not shower for the first 24 hours.  You may shower after the first 24 hours.   Press button if you feel a symptom. You will hear a small click.  Record Date, Time and Symptom in the Patient Log Book.   When you are ready to remove patch, follow instructions on last 2 pages of Patient Log Book.  Stick patch monitor onto last page of Patient Log Book.   Place Patient Log Book in [place] box.  Use locking tab on box and tape box closed securely.  The [employer] and [employer] has [employer] on it.  Please place in mailbox as soon as possible.  Your physician should have your test results approximately 7 days after the monitor has been mailed back to [facility].   Call [place] at [phone] if you have questions regarding your ZIO XT patch monitor.  Call them immediately if you see an orange light blinking on your monitor.   If your monitor falls off in less than 4 days contact our Monitor department at [phone].  If your monitor becomes loose or falls off after 4 days call [employer] at [phone] for suggestions on securing your monitor.

## 2020-06-14 NOTE — Progress Notes (Signed)
Cardiology Consultation:    Date:  06/14/2020   ID:  Kenneth Armstrong, Kenneth Armstrong 06/21/1941, MRN [id number]  PCP:  Kenneth Bender, PA-C  Cardiologist:  Kenneth Campus, MD   Referring MD: Kenneth Bender, PA-C   Chief Complaint  Patient presents with  . Palpitations  . Pre-op Exam    History of Present Illness:    Kenneth Armstrong is a 80 y.o. male who is being seen today for the evaluation of palpitations at the request of Kenneth Armstrong, Kenneth Armstrong.  He is scheduled to have cataract surgery.  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  He is very athletic.  He exercised on the regular basis he walks on the regular basis have no difficulty doing it.  He described to have some palpitations what you mean by dad is the fact that he feels his heart skipping sometimes beating fast but only for very short period of time.  Interestingly in the summertime when he had the sensation he had episode of some strange feeling almost dizziness but never passed out.  He did not notice any decrease in his ability to exercise.  And again he exercised on the regular basis.  He is scheduled to have a cardiac surgery. He smoked pot for short period of time when he was a teenager, He also described to have some right shoulder pain that happen when he exercise.  He can keep going and exercising and continue his exercises that been going for a while.  He told me that years ago he is to have a stress test done on the regular basis every few years as a precautionary measure.  That was done even in spite of lack of symptoms. He does have family history of premature coronary artery disease his father apparently had myocardial infarction before age of 73, however, he was a very heavy smoker. He does have elevated LDL but at the same time he is HDL is very high.  Past Medical History:  Diagnosis Date  . Arthritis    LUMBAR  .  BPH (benign prostatic hyperplasia)   . GERD (gastroesophageal reflux disease)   . Hiatal hernia   . History of palpitations   . Hx of Lyme disease   . Lower urinary tract symptoms (LUTS)   . Wears glasses     Past Surgical History:  Procedure Laterality Date  . CARDIOVASCULAR STRESS TEST  12/05/2008   normal nuclear study w/ no ischemia/  normal LV function and wall motion , ef 70%  . CATARACT EXTRACTION W/PHACO Right 12/19/2015   Procedure: CATARACT EXTRACTION PHACO AND INTRAOCULAR LENS PLACEMENT (IOC);  Surgeon: Leandrew Koyanagi, MD;  Location: [facility] ORS;  Service: Ophthalmology;  Laterality: Right;US 40.0AP%7.6CDE3.03FluidPackLot # [id number]  . COLONOSCOPY    . INGUINAL HERNIA REPAIR Right early 2000's  . PILONIDAL CYST / SINUS EXCISION  1960's  . TONSILLECTOMY  1949  . VARICOSE VEIN SURGERY  1950's and 1970's    Current Medications: Current Meds  Medication Sig  . acetaminophen (TYLENOL) 500 MG tablet Take 500 mg by mouth.  . Cholecalciferol (VITAMIN D3) 2000 units TABS Take 1 tablet by mouth daily.  Marland Kitchen ibuprofen (ADVIL,MOTRIN) 200 MG tablet Take 600 mg by mouth daily.  . Multiple Vitamin (MULITIVITAMIN WITH MINERALS) TABS Take 1 tablet by mouth daily.  Marland Kitchen omeprazole (PRILOSEC) 40 MG capsule Take 40 mg by mouth every  morning.     Allergies:   Patient has no known allergies.   Social History   Socioeconomic History  . Marital status: Married    Spouse name: Not on file  . Number of children: Not on file  . Years of education: Not on file  . Highest education level: Not on file  Occupational History  . Occupation: retired  Tobacco Use  . Smoking status: Former Smoker    Packs/day: 1.00    Years: 10.00    Pack years: 10.00    Quit date: 08/24/1968    Years since quitting: 51.8  . Smokeless tobacco: Never Used  Substance and Sexual Activity  . Alcohol use: Yes    Comment: occasional   . Drug use: No  . Sexual activity: Not on file  Other Topics Concern  . Not on  file  Social History Narrative  . Not on file   Social Determinants of Health   Financial Resource Strain:   . Difficulty of Paying Living Expenses: Not on file  Food Insecurity:   . Worried About Charity fundraiser in the Last Year: Not on file  . Ran Out of Food in the Last Year: Not on file  Transportation Needs:   . Lack of Transportation (Medical): Not on file  . Lack of Transportation (Non-Medical): Not on file  Physical Activity:   . Days of Exercise per Week: Not on file  . Minutes of Exercise per Session: Not on file  Stress:   . Feeling of Stress : Not on file  Social Connections:   . Frequency of Communication with Friends and Family: Not on file  . Frequency of Social Gatherings with Friends and Family: Not on file  . Attends Religious Services: Not on file  . Active Member of Clubs or Organizations: Not on file  . Attends Archivist Meetings: Not on file  . Marital Status: Not on file     Family History: The patient's family history includes Colon cancer in his brother; Coronary artery disease in his father; Hypertension in his mother; Lung cancer in his father. ROS:   Please see the history of present illness.    All 14 point review of systems negative except as described per history of present illness.  EKGs/Labs/Other Studies Reviewed:    The following studies were reviewed today:   EKG:  EKG is  ordered today.  The ekg ordered today demonstrates unusual P wave axis and EKG telling me that this probably ectopic atrial rhythm with heart rate of 55.  Normal QS complex duration morphology nonspecific ST segment changes  Recent Labs: No results found for requested labs within last 8760 hours.  Recent Lipid Panel No results found for: CHOL, TRIG, HDL, CHOLHDL, VLDL, LDLCALC, LDLDIRECT  Physical Exam:    VS:  BP 100/70 (BP Location: Left Arm, Patient Position: Sitting, Cuff Size: Normal)   Pulse (!) 55   Ht 6' (1.829 m)   Wt 191 lb (86.6 kg)    SpO2 96%   BMI 25.90 kg/m     Wt Readings from Last 3 Encounters:  06/14/20 191 lb (86.6 kg)  06/05/16 198 lb (89.8 kg)  12/19/15 195 lb (88.5 kg)     GEN:  Well nourished, well developed in no acute distress HEENT: Normal NECK: No JVD; No carotid bruits LYMPHATICS: No lymphadenopathy CARDIAC: RRR, no murmurs, no rubs, no gallops RESPIRATORY:  Clear to auscultation without rales, wheezing or rhonchi  ABDOMEN: Soft, non-tender,  non-distended MUSCULOSKELETAL:  No edema; No deformity  SKIN: Warm and dry NEUROLOGIC:  Alert and oriented x 3 PSYCHIATRIC:  Normal affect   ASSESSMENT:    1. Palpitations   2. Atypical chest pain   3. Dizziness   4. Dyslipidemia    PLAN:    In order of problems listed above:  1. Palpitations.  Have asked him to wear Zio patch to see exactly what kind of arrhythmia if any we dealing with.  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  Of course concern is about potential bradycardia, however at least recently no symptoms therefore I doubt will discover something alarming.  I will ask him to have an echocardiogram to assess left ventricle ejection fraction. 2. Atypical chest pain in the future we talked about potentially doing stress test but at the same time he is very active and have no difficulty doing it now will wait for results of Zio patch to decide about how we can evaluate his coronary artery disease. 3. Dizziness that happened only in the summertime.  Now he does not have it.  Again he will wear monitor asking to press the button and tells [place] about his symptomatology when he wear monitor. 4. Dyslipidemia no need to treat this unless we have some more information which will obtain by doing above-mentioned testing. 5. In terms of a cardiac surgery overall is considered very low risk surgery and does not require any cardiac evaluation before that.  Therefore, I think he is be fine to proceed with surgery without any  reservations obviously if he would be there I will we need to avoid any sinus blocking agents including I drops.   Medication Adjustments/Labs and Tests Ordered: Current medicines are reviewed at length with the patient today.  Concerns regarding medicines are outlined above.  Orders Placed This Encounter  Procedures  . LONG TERM MONITOR (3-14 DAYS)  . EKG 12-Lead  . ECHOCARDIOGRAM COMPLETE   No orders of the defined types were placed in this encounter.   Signed, Park Liter, MD, [facility]. 06/14/2020 4:30 PM    [place]

## 2020-06-19 ENCOUNTER — Telehealth: Payer: Self-pay | Admitting: Cardiology

## 2020-06-19 NOTE — Telephone Encounter (Addendum)
Pt has been cleared according to the 06/14/20 office visit note.... will notify the office and have them fax is last OV note to them this afternoon.   Please fax it to [phone] and ATTN to Vance Peper  Per Dr. Wendy Poet note:   In terms of a cardiac surgery overall is considered very low risk surgery and does not require any cardiac evaluation before that.  Therefore, I think he is be fine to proceed with surgery without any reservations obviously if he would be there I will we need to avoid any sinus blocking agents including I drops.

## 2020-06-19 NOTE — Telephone Encounter (Signed)
° ° ° °  Jeronimo Norma from [facility] hospital calling, she said if she can get the clearance from Dr. Agustin Cree for pt. The surgery is for tomorrow. Please fax it to [phone] and ATTN to [employer]

## 2020-07-03 ENCOUNTER — Other Ambulatory Visit: Payer: Medicare Other

## 2020-07-12 ENCOUNTER — Ambulatory Visit (INDEPENDENT_AMBULATORY_CARE_PROVIDER_SITE_OTHER): Payer: Medicare Other

## 2020-07-12 ENCOUNTER — Other Ambulatory Visit: Payer: Self-pay

## 2020-07-12 DIAGNOSIS — R42 Dizziness and giddiness: Secondary | ICD-10-CM

## 2020-07-12 DIAGNOSIS — R002 Palpitations: Secondary | ICD-10-CM | POA: Diagnosis not present

## 2020-07-12 DIAGNOSIS — I517 Cardiomegaly: Secondary | ICD-10-CM

## 2020-07-12 LAB — ECHOCARDIOGRAM COMPLETE
Area-P 1/2: 3.6 cm2
Calc EF: 51.3 %
S' Lateral: 3.2 cm
Single Plane A2C EF: 54.6 %
Single Plane A4C EF: 47.3 %

## 2020-07-12 NOTE — Progress Notes (Signed)
Complete echocardiogram has been performed.  Jimmy Jimmylee Ratterree RDCS, RVT 

## 2020-07-24 ENCOUNTER — Other Ambulatory Visit: Payer: Self-pay

## 2020-07-24 DIAGNOSIS — Z8619 Personal history of other infectious and parasitic diseases: Secondary | ICD-10-CM | POA: Insufficient documentation

## 2020-07-24 DIAGNOSIS — N400 Enlarged prostate without lower urinary tract symptoms: Secondary | ICD-10-CM | POA: Insufficient documentation

## 2020-07-24 DIAGNOSIS — M1990 Unspecified osteoarthritis, unspecified site: Secondary | ICD-10-CM | POA: Insufficient documentation

## 2020-07-24 DIAGNOSIS — K449 Diaphragmatic hernia without obstruction or gangrene: Secondary | ICD-10-CM | POA: Insufficient documentation

## 2020-07-24 DIAGNOSIS — K219 Gastro-esophageal reflux disease without esophagitis: Secondary | ICD-10-CM | POA: Insufficient documentation

## 2020-07-24 DIAGNOSIS — R399 Unspecified symptoms and signs involving the genitourinary system: Secondary | ICD-10-CM | POA: Insufficient documentation

## 2020-07-24 DIAGNOSIS — Z87898 Personal history of other specified conditions: Secondary | ICD-10-CM | POA: Insufficient documentation

## 2020-07-24 DIAGNOSIS — Z973 Presence of spectacles and contact lenses: Secondary | ICD-10-CM | POA: Insufficient documentation

## 2020-07-26 ENCOUNTER — Other Ambulatory Visit: Payer: Self-pay

## 2020-07-26 ENCOUNTER — Encounter: Payer: Self-pay | Admitting: Cardiology

## 2020-07-26 ENCOUNTER — Ambulatory Visit (INDEPENDENT_AMBULATORY_CARE_PROVIDER_SITE_OTHER): Payer: Medicare Other | Admitting: Cardiology

## 2020-07-26 VITALS — BP 126/80 | HR 64 | Ht 71.0 in | Wt 193.0 lb

## 2020-07-26 DIAGNOSIS — R002 Palpitations: Secondary | ICD-10-CM | POA: Diagnosis not present

## 2020-07-26 DIAGNOSIS — R0789 Other chest pain: Secondary | ICD-10-CM | POA: Diagnosis not present

## 2020-07-26 DIAGNOSIS — R42 Dizziness and giddiness: Secondary | ICD-10-CM | POA: Diagnosis not present

## 2020-07-26 DIAGNOSIS — E785 Hyperlipidemia, unspecified: Secondary | ICD-10-CM

## 2020-07-26 MED ORDER — ATORVASTATIN CALCIUM 10 MG PO TABS: 10.0000 mg | ORAL_TABLET | Freq: Every day | ORAL | 1 refills | Status: DC

## 2020-07-26 MED ORDER — ASPIRIN EC 81 MG PO TBEC: 81.0000 mg | DELAYED_RELEASE_TABLET | Freq: Every day | ORAL | 3 refills | Status: AC

## 2020-07-26 NOTE — Patient Instructions (Signed)
Medication Instructions:  Your physician has recommended you make the following change in your medication:   START: Lipitor 10 mg daily   START: aspirin 81 mg daily   *If you need a refill on your cardiac medications before your next appointment, please call your pharmacy*   Lab Work: None If you have labs (blood work) drawn today and your tests are completely normal, you will receive your results only by: Marland Kitchen MyChart Message (if you have MyChart) OR . A paper copy in the mail If you have any lab test that is abnormal or we need to change your treatment, we will call you to review the results.   Testing/Procedures:   [facility] [address] [place], [place] [address] Phone:  [phone]    Please arrive 15 minutes prior to your appointment time for registration and insurance purposes.  The test will take approximately 3 to 4 hours to complete; you may bring reading material.  If someone comes with you to your appointment, they will need to remain in the main lobby due to limited space in the testing area. **If you are pregnant or breastfeeding, please notify the nuclear lab prior to your appointment**  How to prepare for your Myocardial Perfusion Test: . Do not eat or drink 3 hours prior to your test, except you may have water. . Do not consume products containing caffeine (regular or decaffeinated) 12 hours prior to your test. (ex: coffee, chocolate, sodas, tea). . Do bring a list of your current medications with you.  If not listed below, you may take your medications as normal. . Do wear comfortable clothes (no dresses or overalls) and walking shoes, tennis shoes preferred (No heels or open toe shoes are allowed). . Do NOT wear cologne, perfume, aftershave, or lotions (deodorant is allowed). . If these instructions are not followed, your test will have to be rescheduled.  Please report to [address] for your test.  If you have questions  or concerns about your appointment, you can call the [facility] at [phone].  If you cannot keep your appointment, please provide 24 hours notification to the Nuclear Lab, to avoid a possible $50 charge to your account.    Follow-Up: At [facility], you and your health needs are our priority.  As part of our continuing mission to provide you with exceptional heart care, we have created designated Provider Care Teams.  These Care Teams include your primary Cardiologist (physician) and Advanced Practice Providers (APPs -  Physician Assistants and Nurse Practitioners) who all work together to provide you with the care you need, when you need it.  We recommend signing up for the patient portal called "MyChart".  Sign up information is provided on this After Visit Summary.  MyChart is used to connect with patients for Virtual Visits (Telemedicine).  Patients are able to view lab/test results, encounter notes, upcoming appointments, etc.  Non-urgent messages can be sent to your provider as well.   To learn more about what you can do with MyChart, go to [email or web address].    Your next appointment:   4 month(s)  The format for your next appointment:   In Person  Provider:   Jenne Campus, MD   Other Instructions   Aspirin and Your Heart  Aspirin is a medicine that prevents the cells in the blood that are used for clotting, called platelets, from sticking together. Aspirin can be used to help reduce the  risk of blood clots, heart attacks, and other heart-related problems. Can I take aspirin? Your health care provider will help you determine whether it is safe and beneficial for you to take aspirin daily. Taking aspirin daily may be helpful if you:  Have had a heart attack or chest pain.  Are at risk for a heart attack.  Have undergone open-heart surgery, such as coronary artery bypass surgery (CABG).  Have had coronary angioplasty or a  stent.  Have had certain types of stroke or transient ischemic attack (TIA).  Have peripheral artery disease (PAD).  Have chronic heart rhythm problems such as atrial fibrillation and cannot take an anticoagulant.  Have valve disease or have had surgery on a valve. What are the risks? Daily use of aspirin can cause side effects. Some of these include:  Bleeding. Bleeding problems can be minor or serious. An example of a minor problem is a cut that does not stop bleeding. An example of a more serious problem is stomach bleeding or, rarely, bleeding into the brain. Your risk of bleeding is increased if you are also taking non-steroidal anti-inflammatory drugs (NSAIDs).  Increased bruising.  Upset stomach.  An allergic reaction. People who have nasal polyps have an increased risk of developing an aspirin allergy. General guidelines  Take aspirin only as told by your health care provider. Make sure that you understand how much you should take and what form you should take. The two forms of aspirin are: ? Non-enteric-coated.This type of aspirin does not have a coating and is absorbed quickly. This type of aspirin also comes in a chewable form. ? Enteric-coated. This type of aspirin has a coating that releases the medicine very slowly. Enteric-coated aspirin might cause less stomach upset than non-enteric-coated aspirin. This type of aspirin should not be chewed or crushed.  Limit alcohol intake to no more than 1 drink a day for nonpregnant women and 2 drinks a day for men. Drinking alcohol increases your risk of bleeding. One drink equals 12 oz of beer, 5 oz of wine, or 1 oz of hard liquor. Contact a health care provider if you:  Have unusual bleeding or bruising.  Have stomach pain or nausea.  Have ringing in your ears.  Have an allergic reaction that causes: ? Hives. ? Itchy skin. ? Swelling of the lips, tongue, or face. Get help right away if you:  Notice that your bowel  movements are bloody, dark red, or black in color.  Vomit or cough up blood.  Have blood in your urine.  Cough, have noisy breathing (wheeze), or feel short of breath.  Have chest pain, especially if the pain spreads to the arms, back, neck, or jaw.  Have a severe headache, or a headache with confusion, or dizziness. These symptoms may represent a serious problem that is an emergency. Do not wait to see if the symptoms will go away. Get medical help right away. Call your local emergency services (911 in the [place] Do not drive yourself to the hospital. Summary  Aspirin can be used to help reduce the risk of blood clots, heart attacks, and other heart-related problems.  Daily use of aspirin can increase your risk of side effects. Your health care provider will help you determine whether it is safe and beneficial for you to take aspirin daily.  Take aspirin only as told by your health care provider. Make sure that you understand how much you can take and what form you can take. This information is  not intended to replace advice given to you by your health care provider. Make sure you discuss any questions you have with your health care provider. Document Revised: 06/10/2017 Document Reviewed: 06/10/2017 Elsevier Patient Education  [place].  Atorvastatin tablets What is this medicine? ATORVASTATIN (a TORE va sta tin) is known as a HMG-CoA reductase inhibitor or 'statin'. It lowers the level of cholesterol and triglycerides in the blood. This drug may also reduce the risk of heart attack, stroke, or other health problems in patients with risk factors for heart disease. Diet and lifestyle changes are often used with this drug. This medicine may be used for other purposes; ask your health care provider or pharmacist if you have questions. COMMON BRAND NAME(S): Lipitor What should I tell my health care provider before I take this medicine? They need to know if you have any of these  conditions:  diabetes  if you often drink alcohol  history of stroke  kidney disease  liver disease  muscle aches or weakness  thyroid disease  an unusual or allergic reaction to atorvastatin, other medicines, foods, dyes, or preservatives  pregnant or trying to get pregnant  breast-feeding How should I use this medicine? Take this medicine by mouth with a glass of water. Follow the directions on the prescription label. You can take it with or without food. If it upsets your stomach, take it with food. Do not take with grapefruit juice. Take your medicine at regular intervals. Do not take it more often than directed. Do not stop taking except on your doctor's advice. Talk to your pediatrician regarding the use of this medicine in children. While this drug may be prescribed for children as young as 10 for selected conditions, precautions do apply. Overdosage: If you think you have taken too much of this medicine contact a poison control center or emergency room at once. NOTE: This medicine is only for you. Do not share this medicine with others. What if I miss a dose? If you miss a dose, take it as soon as you can. If your next dose is to be taken in less than 12 hours, then do not take the missed dose. Take the next dose at your regular time. Do not take double or extra doses. What may interact with this medicine? Do not take this medicine with any of the following medications:  dasabuvir; ombitasvir; paritaprevir; ritonavir  ombitasvir; paritaprevir; ritonavir  posaconazole  red yeast rice This medicine may also interact with the following medications:  alcohol  birth control pills  certain antibiotics like erythromycin and clarithromycin  certain antivirals for HIV or hepatitis  certain medicines for cholesterol like fenofibrate, gemfibrozil, and niacin  certain medicines for fungal infections like ketoconazole and  itraconazole  colchicine  cyclosporine  digoxin  grapefruit juice  rifampin This list may not describe all possible interactions. Give your health care provider a list of all the medicines, herbs, non-prescription drugs, or dietary supplements you use. Also tell them if you smoke, drink alcohol, or use illegal drugs. Some items may interact with your medicine. What should I watch for while using this medicine? Visit your doctor or health care professional for regular check-ups. You may need regular tests to make sure your liver is working properly. Your health care professional may tell you to stop taking this medicine if you develop muscle problems. If your muscle problems do not go away after stopping this medicine, contact your health care professional. Do not become pregnant while  taking this medicine. Women should inform their health care professional if they wish to become pregnant or think they might be pregnant. There is a potential for serious side effects to an unborn child. Talk to your health care professional or pharmacist for more information. Do not breast-feed an infant while taking this medicine. This medicine may increase blood sugar. Ask your healthcare provider if changes in diet or medicines are needed if you have diabetes. If you are going to need surgery or other procedure, tell your doctor that you are using this medicine. This drug is only part of a total heart-health program. Your doctor or a dietician can suggest a low-cholesterol and low-fat diet to help. Avoid alcohol and smoking, and keep a proper exercise schedule. This medicine may cause a decrease in Co-Enzyme Q-10. You should make sure that you get enough Co-Enzyme Q-10 while you are taking this medicine. Discuss the foods you eat and the vitamins you take with your health care professional. What side effects may I notice from receiving this medicine? Side effects that you should report to your doctor or health  care professional as soon as possible:  allergic reactions like skin rash, itching or hives, swelling of the face, lips, or tongue  fever  joint pain  loss of memory  redness, blistering, peeling or loosening of the skin, including inside the mouth  signs and symptoms of high blood sugar such as being more thirsty or hungry or having to urinate more than normal. You may also feel very tired or have blurry vision.  signs and symptoms of liver injury like dark yellow or brown urine; general ill feeling or flu-like symptoms; light-belly pain; unusually weak or tired; yellowing of the eyes or skin  signs and symptoms of muscle injury like dark urine; trouble passing urine or change in the amount of urine; unusually weak or tired; muscle pain or side or back pain Side effects that usually do not require medical attention (report to your doctor or health care professional if they continue or are bothersome):  diarrhea  nausea  stomach pain  trouble sleeping  upset stomach This list may not describe all possible side effects. Call your doctor for medical advice about side effects. You may report side effects to FDA at 1-800-FDA-1088. Where should I keep my medicine? Keep out of the reach of children. Store between 20 and 25 degrees C (68 and 77 degrees F). Throw away any unused medicine after the expiration date. NOTE: This sheet is a summary. It may not cover all possible information. If you have questions about this medicine, talk to your doctor, pharmacist, or health care provider.  2020 Elsevier/Gold Standard (2018-06-01 11:36:16)

## 2020-07-26 NOTE — Progress Notes (Signed)
Cardiology Office Note:    Date:  07/26/2020   ID:  Kenneth, Armstrong [birth year], MRN [id number]  PCP:  Cyndi Bender, PA-C  Cardiologist:  Jenne Campus, MD    Referring MD: Cyndi Bender, PA-C   Chief Complaint  Patient presents with  . Follow-up  I am doing fine but have some shoulder pain  History of Present Illness:    Kenneth Armstrong is a 79 y.o. male was referred to [place] originally because of palpitations.  Monitor being done which showed some short runs of supraventricular tachycardia.  He is overall not bothered by that and he does not want to do anything about it.  A couple other things that he would like to discuss with me what is the fact that he does suffer from pain in the right shoulder why he pushed himself walking.  He like to walks and he walks a lot when he goes uphill he will develop pain in the right shoulder.  That goes away after a few minutes of resting.  Denies having any chest pain tightness squeezing pressure burning chest.  Otherwise seems to be doing well no dizziness no passing out.  Past Medical History:  Diagnosis Date  . Arthritis    LUMBAR  . BPH (benign prostatic hyperplasia)   . GERD (gastroesophageal reflux disease)   . Hiatal hernia   . History of palpitations   . Hx of Lyme disease   . Lower urinary tract symptoms (LUTS)   . Wears glasses     Past Surgical History:  Procedure Laterality Date  . CARDIOVASCULAR STRESS TEST  12/05/2008   normal nuclear study w/ no ischemia/  normal LV function and wall motion , ef 70%  . CATARACT EXTRACTION W/PHACO Right 12/19/2015   Procedure: CATARACT EXTRACTION PHACO AND INTRAOCULAR LENS PLACEMENT (IOC);  Surgeon: Leandrew Koyanagi, MD;  Location: [facility] ORS;  Service: Ophthalmology;  Laterality: Right;US 40.0AP%7.6CDE3.03FluidPackLot # [id number]  . COLONOSCOPY    . INGUINAL HERNIA REPAIR Right early 2000's  . PILONIDAL CYST / SINUS EXCISION  1960's  . TONSILLECTOMY  1949  . VARICOSE VEIN SURGERY  1950's  and 1970's    Current Medications: Current Meds  Medication Sig  . acetaminophen (TYLENOL) 500 MG tablet Take 500 mg by mouth.  Marland Kitchen ibuprofen (ADVIL,MOTRIN) 200 MG tablet Take 600 mg by mouth daily.  . Multiple Vitamin (MULITIVITAMIN WITH MINERALS) TABS Take 1 tablet by mouth daily.  Marland Kitchen omeprazole (PRILOSEC) 40 MG capsule Take 20 mg by mouth every morning.      Allergies:   Patient has no known allergies.   Social History   Socioeconomic History  . Marital status: Married    Spouse name: Not on file  . Number of children: Not on file  . Years of education: Not on file  . Highest education level: Not on file  Occupational History  . Occupation: retired  Tobacco Use  . Smoking status: Former Smoker    Packs/day: 1.00    Years: 10.00    Pack years: 10.00    Quit date: 08/24/1968    Years since quitting: 51.9  . Smokeless tobacco: Never Used  Substance and Sexual Activity  . Alcohol use: Yes    Comment: occasional   . Drug use: No  . Sexual activity: Not on file  Other Topics Concern  . Not on file  Social History Narrative  . Not on file   Social Determinants of Health   Financial Resource Strain:   .  Difficulty of Paying Living Expenses: Not on file  Food Insecurity:   . Worried About Charity fundraiser in the Last Year: Not on file  . Ran Out of Food in the Last Year: Not on file  Transportation Needs:   . Lack of Transportation (Medical): Not on file  . Lack of Transportation (Non-Medical): Not on file  Physical Activity:   . Days of Exercise per Week: Not on file  . Minutes of Exercise per Session: Not on file  Stress:   . Feeling of Stress : Not on file  Social Connections:   . Frequency of Communication with Friends and Family: Not on file  . Frequency of Social Gatherings with Friends and Family: Not on file  . Attends Religious Services: Not on file  . Active Member of Clubs or Organizations: Not on file  . Attends Archivist Meetings: Not on  file  . Marital Status: Not on file     Family History: The patient's family history includes Colon cancer in his brother; Coronary artery disease in his father; Hypertension in his mother; Lung cancer in his father. ROS:   Please see the history of present illness.    All 14 point review of systems negative except as described per history of present illness  EKGs/Labs/Other Studies Reviewed:      Recent Labs: No results found for requested labs within last 8760 hours.  Recent Lipid Panel No results found for: CHOL, TRIG, HDL, CHOLHDL, VLDL, LDLCALC, LDLDIRECT  Physical Exam:    VS:  BP 126/80 (BP Location: Left Arm, Patient Position: Sitting)   Pulse 64   Ht 5\' 11"  (1.803 m)   Wt 193 lb (87.5 kg)   SpO2 96%   BMI 26.92 kg/m     Wt Readings from Last 3 Encounters:  07/26/20 193 lb (87.5 kg)  06/14/20 191 lb (86.6 kg)  06/05/16 198 lb (89.8 kg)     GEN:  Well nourished, well developed in no acute distress HEENT: Normal NECK: No JVD; No carotid bruits LYMPHATICS: No lymphadenopathy CARDIAC: RRR, no murmurs, no rubs, no gallops RESPIRATORY:  Clear to auscultation without rales, wheezing or rhonchi  ABDOMEN: Soft, non-tender, non-distended MUSCULOSKELETAL:  No edema; No deformity  SKIN: Warm and dry LOWER EXTREMITIES: no swelling NEUROLOGIC:  Alert and oriented x 3 PSYCHIATRIC:  Normal affect   ASSESSMENT:    1. Palpitations   2. Dyslipidemia   3. Atypical chest pain   4. Dizziness    PLAN:    In order of problems listed above:  1. Palpitations: We discussed finding of his monitor.  He does not want to do anything about supraventricular tachycardia we will continue monitoring with understanding if that became worse he will let us know. 2. Dyslipidemia: I did calculate his 10 years risk for cardiovascular event it came as 25%.  This is a high risk and I recommended at least moderate intensity statin.  He accepted my offer he will start taking 10 mg of Lipitor  every single day.  I also offered him to start taking 1 baby aspirin every single day. 3. Atypical chest pain/shoulder pain.  With his risk of 25% I will schedule him to have Lexiscan to rule out significant ischemia. 4. Dizziness: Denies having any, I asked him to let me know if he still having some.   Medication Adjustments/Labs and Tests Ordered: Current medicines are reviewed at length with the patient today.  Concerns regarding medicines are outlined above.  Orders Placed This Encounter  Procedures  . MYOCARDIAL PERFUSION IMAGING   Medication changes:  Meds ordered this encounter  Medications  . atorvastatin (LIPITOR) 10 MG tablet    Sig: Take 1 tablet (10 mg total) by mouth daily.    Dispense:  90 tablet    Refill:  1  . aspirin EC 81 MG tablet    Sig: Take 1 tablet (81 mg total) by mouth daily.    Dispense:  90 tablet    Refill:  3    Signed, Park Liter, MD, [facility] 07/26/2020 4:16 PM    [facility]

## 2020-07-29 ENCOUNTER — Telehealth (HOSPITAL_COMMUNITY): Payer: Self-pay | Admitting: *Deleted

## 2020-07-29 NOTE — Telephone Encounter (Signed)
Left message on voicemail per DPR in reference to upcoming appointment scheduled on 07/31/20 with detailed instructions given per Myocardial Perfusion Study Information Sheet for the test. LM to arrive 15 minutes early, and that it is imperative to arrive on time for appointment to keep from having the test rescheduled. If you need to cancel or reschedule your appointment, please call the office within 24 hours of your appointment. Failure to do so may result in a cancellation of your appointment, and a $50 no show fee. Phone number given for call back for any questions. Kirstie Peri

## 2020-07-30 NOTE — Addendum Note (Signed)
Addended by: Senaida Ores on: 07/30/2020 08:07 AM   Modules accepted: Orders

## 2020-07-31 ENCOUNTER — Ambulatory Visit (INDEPENDENT_AMBULATORY_CARE_PROVIDER_SITE_OTHER): Payer: Medicare Other

## 2020-07-31 ENCOUNTER — Other Ambulatory Visit: Payer: Self-pay

## 2020-07-31 DIAGNOSIS — R0789 Other chest pain: Secondary | ICD-10-CM

## 2020-07-31 LAB — MYOCARDIAL PERFUSION IMAGING
LV dias vol: 75 mL (ref 62–150)
LV sys vol: 26 mL
Peak HR: 86 {beats}/min
Rest HR: 60 {beats}/min
SDS: 1
SRS: 2
SSS: 3
TID: 0.91

## 2020-07-31 MED ORDER — TECHNETIUM TC 99M TETROFOSMIN IV KIT
10.9000 | PACK | Freq: Once | INTRAVENOUS | Status: AC | PRN
  Administered 2020-07-31: 10.9 via INTRAVENOUS

## 2020-07-31 MED ORDER — TECHNETIUM TC 99M TETROFOSMIN IV KIT
30.3000 | PACK | Freq: Once | INTRAVENOUS | Status: AC | PRN
  Administered 2020-07-31: 30.3 via INTRAVENOUS

## 2020-07-31 MED ORDER — REGADENOSON 0.4 MG/5ML IV SOLN
0.4000 mg | Freq: Once | INTRAVENOUS | Status: AC
Start: 2020-07-31 — End: 2020-07-31
  Administered 2020-07-31: 0.4 mg via INTRAVENOUS

## 2020-08-01 NOTE — Addendum Note (Signed)
Addended by: Jenne Campus on: 08/01/2020 09:15 AM   Modules accepted: Orders

## 2020-08-08 ENCOUNTER — Telehealth: Payer: Self-pay | Admitting: Cardiology

## 2020-08-08 NOTE — Telephone Encounter (Signed)
Patient isnt feeling well and is unable to come in the office for his appt tomorrow, he wants to change his appt to a Virtual. Please advise.

## 2020-08-08 NOTE — Telephone Encounter (Signed)
Fine with me

## 2020-08-09 ENCOUNTER — Telehealth (INDEPENDENT_AMBULATORY_CARE_PROVIDER_SITE_OTHER): Payer: Medicare Other | Admitting: Cardiology

## 2020-08-09 ENCOUNTER — Encounter: Payer: Self-pay | Admitting: Cardiology

## 2020-08-09 VITALS — Ht 71.5 in | Wt 185.0 lb

## 2020-08-09 DIAGNOSIS — E785 Hyperlipidemia, unspecified: Secondary | ICD-10-CM

## 2020-08-09 DIAGNOSIS — R9439 Abnormal result of other cardiovascular function study: Secondary | ICD-10-CM | POA: Insufficient documentation

## 2020-08-09 DIAGNOSIS — R0789 Other chest pain: Secondary | ICD-10-CM

## 2020-08-09 DIAGNOSIS — Z87898 Personal history of other specified conditions: Secondary | ICD-10-CM

## 2020-08-09 HISTORY — DX: Abnormal result of other cardiovascular function study: R94.39

## 2020-08-09 MED ORDER — NITROGLYCERIN 0.4 MG SL SUBL: 0.4000 mg | SUBLINGUAL_TABLET | SUBLINGUAL | 11 refills | Status: DC | PRN

## 2020-08-09 MED ORDER — RANOLAZINE ER 500 MG PO TB12: 500.0000 mg | ORAL_TABLET | Freq: Two times a day (BID) | ORAL | 2 refills | Status: DC

## 2020-08-09 NOTE — Patient Instructions (Signed)
Medication Instructions:  Your physician has recommended you make the following change in your medication:  TAKE AS NEEDED FOR CHEST PAIN: Nitroglycerin 0.4 mg sublingual (under your tongue) as needed for chest pain. If experiencing chest pain, stop what you are doing and sit down. Take 1 nitroglycerin and wait 5 minutes. If chest pain continues, take another nitroglycerin and wait 5 minutes. If chest pain does not subside, take 1 more nitroglycerin and dial 911. You make take a total of 3 nitroglycerin in a 15 minute time frame.  START: Ranexa 500 mg twice daily  *If you need a refill on your cardiac medications before your next appointment, please call your pharmacy*   Lab Work: None. If you have labs (blood work) drawn today and your tests are completely normal, you will receive your results only by: Marland Kitchen MyChart Message (if you have MyChart) OR . A paper copy in the mail If you have any lab test that is abnormal or we need to change your treatment, we will call you to review the results.   Testing/Procedures: None   Follow-Up: At [facility], you and your health needs are our priority.  As part of our continuing mission to provide you with exceptional heart care, we have created designated Provider Care Teams.  These Care Teams include your primary Cardiologist (physician) and Advanced Practice Providers (APPs -  Physician Assistants and Nurse Practitioners) who all work together to provide you with the care you need, when you need it.  We recommend signing up for the patient portal called "MyChart".  Sign up information is provided on this After Visit Summary.  MyChart is used to connect with patients for Virtual Visits (Telemedicine).  Patients are able to view lab/test results, encounter notes, upcoming appointments, etc.  Non-urgent messages can be sent to your provider as well.   To learn more about what you can do with MyChart, go to [email or web address].    Your next  appointment:   2 week(s)  The format for your next appointment:   In Person  Provider:   Jenne Campus, MD   Other Instructions  Nitroglycerin sublingual tablets What is this medicine? NITROGLYCERIN (nye troe GLI ser in) is a type of vasodilator. It relaxes blood vessels, increasing the blood and oxygen supply to your heart. This medicine is used to relieve chest pain caused by angina. It is also used to prevent chest pain before activities like climbing stairs, going outdoors in cold weather, or sexual activity. This medicine may be used for other purposes; ask your health care provider or pharmacist if you have questions. COMMON BRAND NAME(S): Nitroquick, Nitrostat, Nitrotab What should I tell my health care provider before I take this medicine? They need to know if you have any of these conditions:  anemia  head injury, recent stroke, or bleeding in the brain  liver disease  previous heart attack  an unusual or allergic reaction to nitroglycerin, other medicines, foods, dyes, or preservatives  pregnant or trying to get pregnant  breast-feeding How should I use this medicine? Take this medicine by mouth as needed. At the first sign of an angina attack (chest pain or tightness) place one tablet under your tongue. You can also take this medicine 5 to 10 minutes before an event likely to produce chest pain. Follow the directions on the prescription label. Let the tablet dissolve under the tongue. Do not swallow whole. Replace the dose if you accidentally swallow it. It will help if your mouth is  not dry. Saliva around the tablet will help it to dissolve more quickly. Do not eat or drink, smoke or chew tobacco while a tablet is dissolving. If you are not better within 5 minutes after taking ONE dose of nitroglycerin, call 9-1-1 immediately to seek emergency medical care. Do not take more than 3 nitroglycerin tablets over 15 minutes. If you take this medicine often to relieve  symptoms of angina, your doctor or health care professional may provide you with different instructions to manage your symptoms. If symptoms do not go away after following these instructions, it is important to call 9-1-1 immediately. Do not take more than 3 nitroglycerin tablets over 15 minutes. Talk to your pediatrician regarding the use of this medicine in children. Special care may be needed. Overdosage: If you think you have taken too much of this medicine contact a poison control center or emergency room at once. NOTE: This medicine is only for you. Do not share this medicine with others. What if I miss a dose? This does not apply. This medicine is only used as needed. What may interact with this medicine? Do not take this medicine with any of the following medications:  certain migraine medicines like ergotamine and dihydroergotamine (DHE)  medicines used to treat erectile dysfunction like sildenafil, tadalafil, and vardenafil  riociguat This medicine may also interact with the following medications:  alteplase  aspirin  heparin  medicines for high blood pressure  medicines for mental depression  other medicines used to treat angina  phenothiazines like chlorpromazine, mesoridazine, prochlorperazine, thioridazine This list may not describe all possible interactions. Give your health care provider a list of all the medicines, herbs, non-prescription drugs, or dietary supplements you use. Also tell them if you smoke, drink alcohol, or use illegal drugs. Some items may interact with your medicine. What should I watch for while using this medicine? Tell your doctor or health care professional if you feel your medicine is no longer working. Keep this medicine with you at all times. Sit or lie down when you take your medicine to prevent falling if you feel dizzy or faint after using it. Try to remain calm. This will help you to feel better faster. If you feel dizzy, take several deep  breaths and lie down with your feet propped up, or bend forward with your head resting between your knees. You may get drowsy or dizzy. Do not drive, use machinery, or do anything that needs mental alertness until you know how this drug affects you. Do not stand or sit up quickly, especially if you are an older patient. This reduces the risk of dizzy or fainting spells. Alcohol can make you more drowsy and dizzy. Avoid alcoholic drinks. Do not treat yourself for coughs, colds, or pain while you are taking this medicine without asking your doctor or health care professional for advice. Some ingredients may increase your blood pressure. What side effects may I notice from receiving this medicine? Side effects that you should report to your doctor or health care professional as soon as possible:  blurred vision  dry mouth  skin rash  sweating  the feeling of extreme pressure in the head  unusually weak or tired Side effects that usually do not require medical attention (report to your doctor or health care professional if they continue or are bothersome):  flushing of the face or neck  headache  irregular heartbeat, palpitations  nausea, vomiting This list may not describe all possible side effects. Call your doctor  for medical advice about side effects. You may report side effects to FDA at 1-800-FDA-1088. Where should I keep my medicine? Keep out of the reach of children. Store at room temperature between 20 and 25 degrees C (68 and 77 degrees F). Store in [facility] [employer]. Protect from light and moisture. Keep tightly closed. Throw away any unused medicine after the expiration date. NOTE: This sheet is a summary. It may not cover all possible information. If you have questions about this medicine, talk to your doctor, pharmacist, or health care provider.  2020 Elsevier/Gold Standard (2013-06-08 17:57:36)  Ranolazine tablets, extended release What is this medicine? RANOLAZINE (ra  NOE la zeen) is a heart medicine. It is used to treat chronic chest pain (angina). This medicine must be taken regularly. It will not relieve an acute episode of chest pain. This medicine may be used for other purposes; ask your health care provider or pharmacist if you have questions. COMMON BRAND NAME(S): Ranexa What should I tell my health care provider before I take this medicine? They need to know if you have any of these conditions:  heart disease  irregular heartbeat  kidney disease  liver disease  low levels of potassium or magnesium in the blood  an unusual or allergic reaction to ranolazine, other medicines, foods, dyes, or preservatives  pregnant or trying to get pregnant  breast-feeding How should I use this medicine? Take this medicine by mouth with a glass of water. Follow the directions on the prescription label. Do not cut, crush, or chew this medicine. Take with or without food. Do not take this medication with grapefruit juice. Take your doses at regular intervals. Do not take your medicine more often then directed. Talk to your pediatrician regarding the use of this medicine in children. Special care may be needed. Overdosage: If you think you have taken too much of this medicine contact a poison control center or emergency room at once. NOTE: This medicine is only for you. Do not share this medicine with others. What if I miss a dose? If you miss a dose, take it as soon as you can. If it is almost time for your next dose, take only that dose. Do not take double or extra doses. What may interact with this medicine? Do not take this medicine with any of the following medications:  antivirals for HIV or AIDS  cerivastatin  certain antibiotics like chloramphenicol, clarithromycin, dalfopristin; quinupristin, isoniazid, rifabutin, rifampin, rifapentine  certain medicines used for cancer like imatinib, nilotinib  certain medicines for fungal infections like  fluconazole, itraconazole, ketoconazole, posaconazole, voriconazole  certain medicines for irregular heart beat like dronedarone  certain medicines for seizures like carbamazepine, fosphenytoin, oxcarbazepine, phenobarbital, phenytoin  cisapride  conivaptan  cyclosporine  grapefruit or grapefruit juice  lumacaftor; ivacaftor  nefazodone  pimozide  quinacrine  St John's wort  thioridazine This medicine may also interact with the following medications:  alfuzosin  certain medicines for depression, anxiety, or psychotic disturbances like bupropion, citalopram, fluoxetine, fluphenazine, paroxetine, perphenazine, risperidone, sertraline, trifluoperazine  certain medicines for cholesterol like atorvastatin, lovastatin, simvastatin  certain medicines for stomach problems like octreotide, palonosetron, prochlorperazine  eplerenone  ergot alkaloids like dihydroergotamine, ergonovine, ergotamine, methylergonovine  metformin  nicardipine  other medicines that prolong the QT interval (cause an abnormal heart rhythm) like dofetilide, ziprasidone  sirolimus  tacrolimus This list may not describe all possible interactions. Give your health care provider a list of all the medicines, herbs, non-prescription drugs, or dietary supplements you use.  Also tell them if you smoke, drink alcohol, or use illegal drugs. Some items may interact with your medicine. What should I watch for while using this medicine? Visit your doctor for regular check ups. Tell your doctor or healthcare professional if your symptoms do not start to get better or if they get worse. This medicine will not relieve an acute attack of angina or chest pain. This medicine can change your heart rhythm. Your health care provider may check your heart rhythm by ordering an electrocardiogram (ECG) while you are taking this medicine. You may get drowsy or dizzy. Do not drive, use machinery, or do anything that needs mental  alertness until you know how this medicine affects you. Do not stand or sit up quickly, especially if you are an older patient. This reduces the risk of dizzy or fainting spells. Alcohol may interfere with the effect of this medicine. Avoid alcoholic drinks. If you are scheduled for any medical or dental procedure, tell your healthcare provider that you are taking this medicine. This medicine can interact with other medicines used during surgery. What side effects may I notice from receiving this medicine? Side effects that you should report to your doctor or health care professional as soon as possible:  allergic reactions like skin rash, itching or hives, swelling of the face, lips, or tongue  breathing problems  changes in vision  fast, irregular or pounding heartbeat  feeling faint or lightheaded, falls  low or high blood pressure  numbness or tingling feelings  ringing in the ears  tremor or shakiness  slow heartbeat (fewer than 50 beats per minute)  swelling of the legs or feet Side effects that usually do not require medical attention (report to your doctor or health care professional if they continue or are bothersome):  constipation  drowsy  dry mouth  headache  nausea or vomiting  stomach upset This list may not describe all possible side effects. Call your doctor for medical advice about side effects. You may report side effects to FDA at 1-800-FDA-1088. Where should I keep my medicine? Keep out of the reach of children. Store at room temperature between 15 and 30 degrees C (59 and 86 degrees F). Throw away any unused medicine after the expiration date. NOTE: This sheet is a summary. It may not cover all possible information. If you have questions about this medicine, talk to your doctor, pharmacist, or health care provider.  2020 Elsevier/Gold Standard (2018-08-02 09:18:49)

## 2020-08-09 NOTE — Progress Notes (Signed)
Virtual Visit via Telephone Note   This visit type was conducted due to national recommendations for restrictions regarding the COVID-19 Pandemic (e.g. social distancing) in an effort to limit this patient's exposure and mitigate transmission in our community.  Due to his co-morbid illnesses, this patient is at least at moderate risk for complications without adequate follow up.  This format is felt to be most appropriate for this patient at this time.  The patient did not have access to video technology/had technical difficulties with video requiring transitioning to audio format only (telephone).  All issues noted in this document were discussed and addressed.  No physical exam could be performed with this format.  Please refer to the patient's chart for his  consent to telehealth for [facility].  Evaluation Performed:  Follow-up visit  This visit type was conducted due to national recommendations for restrictions regarding the COVID-19 Pandemic (e.g. social distancing).  This format is felt to be most appropriate for this patient at this time.  All issues noted in this document were discussed and addressed.  No physical exam was performed (except for noted visual exam findings with Video Visits).  Please refer to the patient's chart (MyChart message for video visits and phone note for telephone visits) for the patient's consent to telehealth for [facility].  Date:  08/09/2020  ID: Kenneth Armstrong, DOB 08-20-1941, MRN [id number]   Patient Location: [address] [place] [address]   Provider location:   [place] [facility]  PCP:  Cyndi Bender, PA-C  Cardiologist:  Jenne Campus, MD     Chief Complaint: I have abnormal stress test  History of Present Illness:    Kenneth Armstrong is a 79 y.o. male  who presents via audio/video conferencing for a telehealth visit today.  Who was initially sent to [place] because of some palpitations.  Investigation of this problem revealed  only short runs of supraventricular tachycardia overall he was not much better by that and actually quite conservative approach to this problem has been initiated.  However last time when I did see him he mentioned to me that he likes to walk and he walks a lot he said when he goes uphill he developed pain in the right shoulder he slows down and stop pain goes away we elected to pursue stress testing stress test show ischemia involving basal and midportion of the inferior wall and he does have virtual visit today to discuss this.  The reason why he did not want to come to our office is the fact that he does have cold-like symptoms.  There were also multiple family members having the same kind of symptomatology.  None of them has been checked for Covid.  He did have vaccination already.      Prior CV studies:   The following studies were reviewed today:  Stress test reviewed with the patient showing ischemia involving basal midportion of the inferior wall     Past Medical History:  Diagnosis Date  . Arthritis    LUMBAR  . Atypical chest pain 06/14/2020  . BPH (benign prostatic hyperplasia)   . Dizziness 06/14/2020  . Dyslipidemia 06/14/2020  . GERD (gastroesophageal reflux disease)   . Hiatal hernia   . History of palpitations   . Hx of Lyme disease   . Lower urinary tract symptoms (LUTS)   . Palpitations 06/14/2020  . Wears glasses     Past Surgical History:  Procedure Laterality Date  . CARDIOVASCULAR  STRESS TEST  12/05/2008   normal nuclear study w/ no ischemia/  normal LV function and wall motion , ef 70%  . CATARACT EXTRACTION W/PHACO Right 12/19/2015   Procedure: CATARACT EXTRACTION PHACO AND INTRAOCULAR LENS PLACEMENT (IOC);  Surgeon: Leandrew Koyanagi, MD;  Location: [facility] ORS;  Service: Ophthalmology;  Laterality: Right;US 40.0AP%7.6CDE3.03FluidPackLot # [id number]  . COLONOSCOPY    . INGUINAL HERNIA REPAIR Right early 2000's  . PILONIDAL CYST / SINUS EXCISION  1960's  .  TONSILLECTOMY  1949  . VARICOSE VEIN SURGERY  1950's and 1970's     Current Meds  Medication Sig  . acetaminophen (TYLENOL) 500 MG tablet Take 500 mg by mouth.  Marland Kitchen aspirin EC 81 MG tablet Take 1 tablet (81 mg total) by mouth daily.  Marland Kitchen atorvastatin (LIPITOR) 10 MG tablet Take 1 tablet (10 mg total) by mouth daily.  . Cholecalciferol (VITAMIN D3) 2000 units TABS Take 1 tablet by mouth daily.  Marland Kitchen ibuprofen (ADVIL,MOTRIN) 200 MG tablet Take 600 mg by mouth daily.  . Multiple Vitamin (MULITIVITAMIN WITH MINERALS) TABS Take 1 tablet by mouth daily.  Marland Kitchen omeprazole (PRILOSEC) 40 MG capsule Take 20 mg by mouth every morning.       Family History: The patient's family history includes Colon cancer in his brother; Coronary artery disease in his father; Hypertension in his mother; Lung cancer in his father.   ROS:   Please see the history of present illness.     All other systems reviewed and are negative.   Labs/Other Tests and Data Reviewed:     Recent Labs: No results found for requested labs within last 8760 hours.  Recent Lipid Panel No results found for: CHOL, TRIG, HDL, CHOLHDL, VLDL, LDLCALC, LDLDIRECT    Exam:    Vital Signs:  Ht 5' 11.5" (1.816 m)   Wt 185 lb (83.9 kg)   BMI 25.44 kg/m     Wt Readings from Last 3 Encounters:  08/09/20 185 lb (83.9 kg)  07/31/20 193 lb (87.5 kg)  07/26/20 193 lb (87.5 kg)     Well nourished, well developed in no acute distress. Alert awake and attentive.  She sounds pretty good over the phone, we unable to establish video link.  Diagnosis for this visit:   1. History of palpitations   2. Abnormal stress test   3. Dyslipidemia   4. Atypical chest pain      ASSESSMENT & PLAN:    1.  Palpitations seems to be stable does not an issue. 2.  Abnormal stress test showing ischemia very inferior wall we discussed in length the options for the situation option being medical therapy as well as cardiac catheterization.  He definitely  favors to have a cardiac catheterization done.  We discussed this procedure in details including all risk benefits as well as alternative.  He elected to proceed however we cannot wait until he will get better with his cold-like symptoms.  Therefore, I will initiate therapy with nitroglycerin as needed, he is already on statin as well as aspirin.  I will send him prescription for nitroglycerin as needed and gave him instruction how to use it.  We will also start ranolazine.  I will contact him in about 2 weeks to see how he does and then we will schedule him for cardiac catheterization. Dyslipidemia: He is taking Lipitor 10 which I will continue, I do have his K PN which show me his LDL of 72 and HDL 26.  We will continue present  management.  COVID-19 Education: The signs and symptoms of COVID-19 were discussed with the patient and how to seek care for testing (follow up with PCP or arrange E-visit).  The importance of social distancing was discussed today.  Patient Risk:   After full review of this patients clinical status, I feel that they are at least moderate risk at this time.  Time:   Today, I have spent 15 minutes with the patient with telehealth technology discussing pt health issues.  I spent 20 minutes reviewing her chart before the visit.  Visit was finished at 9:30 PM.    Medication Adjustments/Labs and Tests Ordered: Current medicines are reviewed at length with the patient today.  Concerns regarding medicines are outlined above.  No orders of the defined types were placed in this encounter.  Medication changes: No orders of the defined types were placed in this encounter.    Disposition: 2 weeks phone call  Signed, Park Liter, MD, [facility] 08/09/2020 9:27 AM    [place]

## 2020-08-14 ENCOUNTER — Telehealth: Payer: Self-pay | Admitting: Cardiology

## 2020-08-14 NOTE — Telephone Encounter (Signed)
Called patient informed him that his upcoming office visit is not a procedure just a visit he understood. No further questions.

## 2020-08-14 NOTE — Telephone Encounter (Signed)
° ° °  Pt would like to speak with a nurse. He asked if the angioplasty will be done on his appt with Dr. Raliegh Ip on 12/29, he also asking why there's an appt on 01/03

## 2020-08-21 ENCOUNTER — Ambulatory Visit: Payer: Medicare Other | Admitting: Cardiology

## 2020-08-21 ENCOUNTER — Other Ambulatory Visit: Payer: Self-pay

## 2020-08-21 ENCOUNTER — Encounter: Payer: Self-pay | Admitting: Cardiology

## 2020-08-21 VITALS — BP 130/82 | HR 68 | Ht 71.5 in | Wt 191.0 lb

## 2020-08-21 DIAGNOSIS — R9439 Abnormal result of other cardiovascular function study: Secondary | ICD-10-CM

## 2020-08-21 DIAGNOSIS — R002 Palpitations: Secondary | ICD-10-CM

## 2020-08-21 DIAGNOSIS — R0789 Other chest pain: Secondary | ICD-10-CM

## 2020-08-21 DIAGNOSIS — E785 Hyperlipidemia, unspecified: Secondary | ICD-10-CM

## 2020-08-21 NOTE — Progress Notes (Signed)
Cardiology Office Note:    Date:  08/21/2020   ID:  [name], [name] Dec 03, 1940, MRN [id number]  PCP:  Cyndi Bender, PA-C  Cardiologist:  Jenne Campus, MD    Referring MD: Cyndi Bender, PA-C   Chief Complaint  Patient presents with  . Palpitations  . Dizziness    History of Present Illness:    Kenneth Armstrong is a 79 y.o. male with past medical history significant for palpitations actually originally reason for referral was done exactly that palpitations.  Monitor shows some episode of supraventricular tachycardia.  He also does have dyslipidemia with calculated predicted 10 years risk of 25%.  Last time I seen him he still complained of having some exertional right shoulder pain that is relieved by rest.  We elected to do stress test in form of Lexiscan Lexiscan showed area of ischemia involving basal midportion of the inferior wall.  He is here today to talk about that.  He said that when he tried to walk he gets this sensation he also noticed decreased ability to exercise within the last few months.  He likes to be very active he like to finally be more aggressive about now with the diagnosis of potentially coronary artery disease and narrowing of the artery he is afraid to do that.  I brought him today to the office to talk about options for the situation.  I gave him ranolazine last time however he cannot tell me that it make any difference he said may be slightly better but is not convinced  Past Medical History:  Diagnosis Date  . Abnormal stress test 08/09/2020  . Arthritis    LUMBAR  . Atypical chest pain 06/14/2020  . BPH (benign prostatic hyperplasia)   . Dizziness 06/14/2020  . Dyslipidemia 06/14/2020  . GERD (gastroesophageal reflux disease)   . Hiatal hernia   . History of palpitations   . Hx of Lyme disease   . Lower urinary tract symptoms (LUTS)   . Palpitations 06/14/2020  . Wears glasses     Past Surgical History:  Procedure Laterality Date  .  CARDIOVASCULAR STRESS TEST  12/05/2008   normal nuclear study w/ no ischemia/  normal LV function and wall motion , ef 70%  . CATARACT EXTRACTION W/PHACO Right 12/19/2015   Procedure: CATARACT EXTRACTION PHACO AND INTRAOCULAR LENS PLACEMENT (IOC);  Surgeon: Leandrew Koyanagi, MD;  Location: [facility] ORS;  Service: Ophthalmology;  Laterality: Right;US 40.0AP%7.6CDE3.03FluidPackLot # [id number]  . COLONOSCOPY    . INGUINAL HERNIA REPAIR Right early 2000's  . PILONIDAL CYST / SINUS EXCISION  1960's  . TONSILLECTOMY  1949  . VARICOSE VEIN SURGERY  1950's and 1970's    Current Medications: Current Meds  Medication Sig  . acetaminophen (TYLENOL) 500 MG tablet Take 500 mg by mouth.  Marland Kitchen aspirin EC 81 MG tablet Take 1 tablet (81 mg total) by mouth daily.  Marland Kitchen atorvastatin (LIPITOR) 10 MG tablet Take 1 tablet (10 mg total) by mouth daily.  . Cholecalciferol (VITAMIN D3) 2000 units TABS Take 1 tablet by mouth daily.  Marland Kitchen ibuprofen (ADVIL,MOTRIN) 200 MG tablet Take 600 mg by mouth daily.  . Multiple Vitamin (MULITIVITAMIN WITH MINERALS) TABS Take 1 tablet by mouth daily.  . nitroGLYCERIN (NITROSTAT) 0.4 MG SL tablet Place 1 tablet (0.4 mg total) under the tongue every 5 (five) minutes as needed.  Marland Kitchen omeprazole (PRILOSEC) 40 MG capsule Take 20 mg by mouth every morning.   . ranolazine (RANEXA) 500 MG 12 hr tablet Take  1 tablet (500 mg total) by mouth 2 (two) times daily.     Allergies:   Patient has no known allergies.   Social History   Socioeconomic History  . Marital status: Married    Spouse name: Not on file  . Number of children: Not on file  . Years of education: Not on file  . Highest education level: Not on file  Occupational History  . Occupation: retired  Tobacco Use  . Smoking status: Former Smoker    Packs/day: 1.00    Years: 10.00    Pack years: 10.00    Quit date: 08/24/1968    Years since quitting: 52.0  . Smokeless tobacco: Never Used  Substance and Sexual Activity  . Alcohol use:  Yes    Comment: occasional   . Drug use: No  . Sexual activity: Not on file  Other Topics Concern  . Not on file  Social History Narrative  . Not on file   Social Determinants of Health   Financial Resource Strain: Not on file  Food Insecurity: Not on file  Transportation Needs: Not on file  Physical Activity: Not on file  Stress: Not on file  Social Connections: Not on file     Family History: The patient's family history includes Colon cancer in his brother; Coronary artery disease in his father; Hypertension in his mother; Lung cancer in his father. ROS:   Please see the history of present illness.    All 14 point review of systems negative except as described per history of present illness  EKGs/Labs/Other Studies Reviewed:      Recent Labs: No results found for requested labs within last 8760 hours.  Recent Lipid Panel No results found for: CHOL, TRIG, HDL, CHOLHDL, VLDL, LDLCALC, LDLDIRECT  Physical Exam:    VS:  BP 130/82 (BP Location: Right Arm, Patient Position: Sitting)   Pulse 68   Ht 5' 11.5" (1.816 m)   Wt 191 lb (86.6 kg)   SpO2 98%   BMI 26.27 kg/m     Wt Readings from Last 3 Encounters:  08/21/20 191 lb (86.6 kg)  08/09/20 185 lb (83.9 kg)  07/31/20 193 lb (87.5 kg)     GEN:  Well nourished, well developed in no acute distress HEENT: Normal NECK: No JVD; No carotid bruits LYMPHATICS: No lymphadenopathy CARDIAC: RRR, no murmurs, no rubs, no gallops RESPIRATORY:  Clear to auscultation without rales, wheezing or rhonchi  ABDOMEN: Soft, non-tender, non-distended MUSCULOSKELETAL:  No edema; No deformity  SKIN: Warm and dry LOWER EXTREMITIES: no swelling NEUROLOGIC:  Alert and oriented x 3 PSYCHIATRIC:  Normal affect   ASSESSMENT:    1. Abnormal stress test   2. Atypical chest pain   3. Dyslipidemia   4. Palpitations    PLAN:    In order of problems listed above:  1. Abnormal stress test showing ischemia involving basal midportion of  the inferior wall.  We did discuss option including medical therapy versus cardiac catheterization.  He clearly without hesitation prefers to have cardiac catheterization.  He tells me straight that he would like to know for sure if there is a problem.  On top of that he is thinking about exercising more aggressively and he will make sure it safe for him to do that.  I did describe cardiac catheterization to him including all risk benefits as well as alternatives he is willing to proceed. 2. Atypical chest pain negative stress test abnormal plan as described above. 3. Dyslipidemia.  He is on Lipitor 10.  I will make arrangements for fasting lipid profile to be checked. 4. Palpitations still present but he does not want to do anything about it he said he is fine he learned how to live for this and he is more concentrated gradient potentially having coronary artery disease. 5. He is taking very antiplatelet therapy in form of aspirin he is also on statin which I will continue he does have nitroglycerin but did not have to use it.  We will continue with ranolazine for now.  He was told again that he need to go to the emergency room if pain is not relieved by nitroglycerin.  Also told him not to aggressively exercise until cardiac catheterization will be done.   Medication Adjustments/Labs and Tests Ordered: Current medicines are reviewed at length with the patient today.  Concerns regarding medicines are outlined above.  No orders of the defined types were placed in this encounter.  Medication changes: No orders of the defined types were placed in this encounter.   Signed, Kenneth Liter, MD, [facility] 08/21/2020 10:17 AM    [place]

## 2020-08-21 NOTE — Patient Instructions (Signed)
Medication Instructions:  Your physician recommends that you continue on your current medications as directed. Please refer to the Current Medication list given to you today.  *If you need a refill on your cardiac medications before your next appointment, please call your pharmacy*   Lab Work: Your physician recommends that you return for lab work in: TODAY CBC, BMP If you have labs (blood work) drawn today and your tests are completely normal, you will receive your results only by:  MyChart Message (if you have MyChart) OR  A paper copy in the mail If you have any lab test that is abnormal or we need to change your treatment, we will call you to review the results.   Testing/Procedures:    [facility] CARDIOVASCULAR DIVISION [facility] [place] [address] [place] [address] Dept: [phone] Loc: [phone]  Kenneth Armstrong  08/21/2020   You are scheduled for a Cardiac Catheterization on Thursday, January 6 with Dr. Peter [place].  1. Please arrive at the [facility] (Main Entrance A) at [facility]: [address] [place], [place] [address] at 8:30 AM (This time is two hours before your procedure to ensure your preparation). Free valet parking service is available.   Special note: Every effort is made to have your procedure done on time. Please understand that emergencies sometimes delay scheduled procedures.  2. Diet: Do not eat solid foods after midnight.  The patient may have clear liquids until 5am upon the day of the procedure.  3. Labs: You will need to have blood drawn on YOU HAD YOUR LABS DRAWN TODAY 08/21/20 4. Medication instructions in preparation for your procedure:   Contrast Allergy: No  On the morning of your procedure, take your Aspirin and any morning medicines NOT listed above.  You may use sips of water.  5. Plan for one night stay--bring personal belongings. 6. Bring a current list of  your medications and current insurance cards. 7. You MUST have a responsible person to drive you home. 8. Someone MUST be with you the first 24 hours after you arrive home or your discharge will be delayed. 9. Please wear clothes that are easy to get on and off and wear slip-on shoes.  Thank you for allowing [place] to care for you!   -- [facility]    Follow-Up: At [facility], you and your health needs are our priority.  As part of our continuing mission to provide you with exceptional heart care, we have created designated Provider Care Teams.  These Care Teams include your primary Cardiologist (physician) and Advanced Practice Providers (APPs -  Physician Assistants and Nurse Practitioners) who all work together to provide you with the care you need, when you need it.  We recommend signing up for the patient portal called "MyChart".  Sign up information is provided on this After Visit Summary.  MyChart is used to connect with patients for Virtual Visits (Telemedicine).  Patients are able to view lab/test results, encounter notes, upcoming appointments, etc.  Non-urgent messages can be sent to your provider as well.   To learn more about what you can do with MyChart, go to [email or web address].    Your next appointment:   6 week(s)  The format for your next appointment:   In Person  Provider:   Gypsy Balsam, MD   Other Instructions

## 2020-08-21 NOTE — H&P (View-Only) (Signed)
Cardiology Office Note:    Date:  08/21/2020   ID:  Fiyinfoluwa, [place] 1941/04/11, MRN [id number]  PCP:  Cyndi Bender, PA-C  Cardiologist:  Jenne Campus, MD    Referring MD: Cyndi Bender, PA-C   Chief Complaint  Patient presents with  . Palpitations  . Dizziness    History of Present Illness:    Kenneth Armstrong is a 79 y.o. male with past medical history significant for palpitations actually originally reason for referral was done exactly that palpitations.  Monitor shows some episode of supraventricular tachycardia.  He also does have dyslipidemia with calculated predicted 10 years risk of 25%.  Last time I seen him he still complained of having some exertional right shoulder pain that is relieved by rest.  We elected to do stress test in form of Lexiscan Lexiscan showed area of ischemia involving basal midportion of the inferior wall.  He is here today to talk about that.  He said that when he tried to walk he gets this sensation he also noticed decreased ability to exercise within the last few months.  He likes to be very active he like to finally be more aggressive about now with the diagnosis of potentially coronary artery disease and narrowing of the artery he is afraid to do that.  I brought him today to the office to talk about options for the situation.  I gave him ranolazine last time however he cannot tell me that it make any difference he said may be slightly better but is not convinced  Past Medical History:  Diagnosis Date  . Abnormal stress test 08/09/2020  . Arthritis    LUMBAR  . Atypical chest pain 06/14/2020  . BPH (benign prostatic hyperplasia)   . Dizziness 06/14/2020  . Dyslipidemia 06/14/2020  . GERD (gastroesophageal reflux disease)   . Hiatal hernia   . History of palpitations   . Hx of Lyme disease   . Lower urinary tract symptoms (LUTS)   . Palpitations 06/14/2020  . Wears glasses     Past Surgical History:  Procedure Laterality Date  .  CARDIOVASCULAR STRESS TEST  12/05/2008   normal nuclear study w/ no ischemia/  normal LV function and wall motion , ef 70%  . CATARACT EXTRACTION W/PHACO Right 12/19/2015   Procedure: CATARACT EXTRACTION PHACO AND INTRAOCULAR LENS PLACEMENT (IOC);  Surgeon: Leandrew Koyanagi, MD;  Location: [facility] ORS;  Service: Ophthalmology;  Laterality: Right;US 40.0AP%7.6CDE3.03FluidPackLot # [id number]  . COLONOSCOPY    . INGUINAL HERNIA REPAIR Right early 2000's  . PILONIDAL CYST / SINUS EXCISION  1960's  . TONSILLECTOMY  1949  . VARICOSE VEIN SURGERY  1950's and 1970's    Current Medications: Current Meds  Medication Sig  . acetaminophen (TYLENOL) 500 MG tablet Take 500 mg by mouth.  Marland Kitchen aspirin EC 81 MG tablet Take 1 tablet (81 mg total) by mouth daily.  Marland Kitchen atorvastatin (LIPITOR) 10 MG tablet Take 1 tablet (10 mg total) by mouth daily.  . Cholecalciferol (VITAMIN D3) 2000 units TABS Take 1 tablet by mouth daily.  Marland Kitchen ibuprofen (ADVIL,MOTRIN) 200 MG tablet Take 600 mg by mouth daily.  . Multiple Vitamin (MULITIVITAMIN WITH MINERALS) TABS Take 1 tablet by mouth daily.  . nitroGLYCERIN (NITROSTAT) 0.4 MG SL tablet Place 1 tablet (0.4 mg total) under the tongue every 5 (five) minutes as needed.  Marland Kitchen omeprazole (PRILOSEC) 40 MG capsule Take 20 mg by mouth every morning.   . ranolazine (RANEXA) 500 MG 12 hr tablet Take  1 tablet (500 mg total) by mouth 2 (two) times daily.     Allergies:   Patient has no known allergies.   Social History   Socioeconomic History  . Marital status: Married    Spouse name: Not on file  . Number of children: Not on file  . Years of education: Not on file  . Highest education level: Not on file  Occupational History  . Occupation: retired  Tobacco Use  . Smoking status: Former Smoker    Packs/day: 1.00    Years: 10.00    Pack years: 10.00    Quit date: 08/24/1968    Years since quitting: 52.0  . Smokeless tobacco: Never Used  Substance and Sexual Activity  . Alcohol use:  Yes    Comment: occasional   . Drug use: No  . Sexual activity: Not on file  Other Topics Concern  . Not on file  Social History Narrative  . Not on file   Social Determinants of Health   Financial Resource Strain: Not on file  Food Insecurity: Not on file  Transportation Needs: Not on file  Physical Activity: Not on file  Stress: Not on file  Social Connections: Not on file     Family History: The patient's family history includes Colon cancer in his brother; Coronary artery disease in his father; Hypertension in his mother; Lung cancer in his father. ROS:   Please see the history of present illness.    All 14 point review of systems negative except as described per history of present illness  EKGs/Labs/Other Studies Reviewed:      Recent Labs: No results found for requested labs within last 8760 hours.  Recent Lipid Panel No results found for: CHOL, TRIG, HDL, CHOLHDL, VLDL, LDLCALC, LDLDIRECT  Physical Exam:    VS:  BP 130/82 (BP Location: Right Arm, Patient Position: Sitting)   Pulse 68   Ht 5' 11.5" (1.816 m)   Wt 191 lb (86.6 kg)   SpO2 98%   BMI 26.27 kg/m     Wt Readings from Last 3 Encounters:  08/21/20 191 lb (86.6 kg)  08/09/20 185 lb (83.9 kg)  07/31/20 193 lb (87.5 kg)     GEN:  Well nourished, well developed in no acute distress HEENT: Normal NECK: No JVD; No carotid bruits LYMPHATICS: No lymphadenopathy CARDIAC: RRR, no murmurs, no rubs, no gallops RESPIRATORY:  Clear to auscultation without rales, wheezing or rhonchi  ABDOMEN: Soft, non-tender, non-distended MUSCULOSKELETAL:  No edema; No deformity  SKIN: Warm and dry LOWER EXTREMITIES: no swelling NEUROLOGIC:  Alert and oriented x 3 PSYCHIATRIC:  Normal affect   ASSESSMENT:    1. Abnormal stress test   2. Atypical chest pain   3. Dyslipidemia   4. Palpitations    PLAN:    In order of problems listed above:  1. Abnormal stress test showing ischemia involving basal midportion of  the inferior wall.  We did discuss option including medical therapy versus cardiac catheterization.  He clearly without hesitation prefers to have cardiac catheterization.  He tells me straight that he would like to know for sure if there is a problem.  On top of that he is thinking about exercising more aggressively and he will make sure it safe for him to do that.  I did describe cardiac catheterization to him including all risk benefits as well as alternatives he is willing to proceed. 2. Atypical chest pain negative stress test abnormal plan as described above. 3. Dyslipidemia.  He is on Lipitor 10.  I will make arrangements for fasting lipid profile to be checked. 4. Palpitations still present but he does not want to do anything about it he said he is fine he learned how to live for this and he is more concentrated gradient potentially having coronary artery disease. 5. He is taking very antiplatelet therapy in form of aspirin he is also on statin which I will continue he does have nitroglycerin but did not have to use it.  We will continue with ranolazine for now.  He was told again that he need to go to the emergency room if pain is not relieved by nitroglycerin.  Also told him not to aggressively exercise until cardiac catheterization will be done.   Medication Adjustments/Labs and Tests Ordered: Current medicines are reviewed at length with the patient today.  Concerns regarding medicines are outlined above.  No orders of the defined types were placed in this encounter.  Medication changes: No orders of the defined types were placed in this encounter.   Signed, Park Liter, MD, [facility] 08/21/2020 10:17 AM    [place]

## 2020-08-22 ENCOUNTER — Telehealth: Payer: Self-pay

## 2020-08-22 LAB — BASIC METABOLIC PANEL
BUN/Creatinine Ratio: 17 (ref 10–24)
BUN: 20 mg/dL (ref 8–27)
CO2: 25 mmol/L (ref 20–29)
Calcium: 9.8 mg/dL (ref 8.6–10.2)
Chloride: 104 mmol/L (ref 96–106)
Creatinine, Ser: 1.19 mg/dL (ref 0.76–1.27)
GFR calc Af Amer: 68 mL/min/{1.73_m2} (ref >59)
GFR calc non Af Amer: 59 mL/min/{1.73_m2} — ABNORMAL LOW (ref >59)
Glucose: 91 mg/dL (ref 65–99)
Potassium: 4.6 mmol/L (ref 3.5–5.2)
Sodium: 142 mmol/L (ref 134–144)

## 2020-08-22 LAB — CBC
Hematocrit: 45.7 % (ref 37.5–51.0)
Hemoglobin: 15.9 g/dL (ref 13.0–17.7)
MCH: 31.7 pg (ref 26.6–33.0)
MCHC: 34.8 g/dL (ref 31.5–35.7)
MCV: 91 fL (ref 79–97)
Platelets: 281 10*3/uL (ref 150–450)
RBC: 5.02 x10E6/uL (ref 4.14–5.80)
RDW: 12 % (ref 11.6–15.4)
WBC: 8 10*3/uL (ref 3.4–10.8)

## 2020-08-22 NOTE — Telephone Encounter (Signed)
Patient notified of results and verbalized understanding.  

## 2020-08-22 NOTE — Telephone Encounter (Signed)
-----   Message from Georgeanna Lea, MD sent at 08/22/2020  8:31 AM EST ----- All labs are looking good.

## 2020-08-26 ENCOUNTER — Ambulatory Visit: Payer: Medicare Other | Admitting: Cardiology

## 2020-08-26 ENCOUNTER — Other Ambulatory Visit (HOSPITAL_COMMUNITY)
Admission: RE | Admit: 2020-08-26 | Discharge: 2020-08-26 | Disposition: A | Discharge status: Home / Self Care | Payer: Medicare Other | Source: Ambulatory Visit | Attending: Cardiology | Admitting: Cardiology

## 2020-08-26 DIAGNOSIS — Z20822 Contact with and (suspected) exposure to covid-19: Secondary | ICD-10-CM | POA: Diagnosis not present

## 2020-08-26 DIAGNOSIS — Z01812 Encounter for preprocedural laboratory examination: Secondary | ICD-10-CM | POA: Insufficient documentation

## 2020-08-27 ENCOUNTER — Telehealth: Payer: Self-pay | Admitting: *Deleted

## 2020-08-27 ENCOUNTER — Other Ambulatory Visit (HOSPITAL_COMMUNITY): Payer: Medicare Other

## 2020-08-27 LAB — SARS CORONAVIRUS 2 (TAT 6-24 HRS): SARS Coronavirus 2: NEGATIVE

## 2020-08-27 NOTE — Telephone Encounter (Signed)
Pt contacted pre-catheterization scheduled at [facility] for: Thursday August 29, 2020 10:30 AM Verified arrival time and place: [facility] Main Entrance A [facility]) at: 8:30 AM   No solid food after midnight prior to cath, clear liquids until 5 AM day of procedure.  Hold: Ibuprofen-until post procedure-GFR 59  AM meds can be  taken pre-cath with sips of water including: ASA 81 mg   Confirmed patient has responsible adult to drive home post procedure and be with patient first 24 hours after arriving home: yes  You are allowed ONE visitor in the waiting room during the time you are at the hospital for your procedure. Both you and your visitor must wear a mask once you enter the hospital.       COVID-19 Pre-Screening Questions:  . In the past 14 days have you had any symptoms concerning for COVID-19 infection (fever, chills, cough, or new shortness of breath)? no . In the past 14 days have you been around anyone with known Covid 19? no   Reviewed procedure/mask/visitor instructions, COVID-19 questions with patient

## 2020-08-29 ENCOUNTER — Other Ambulatory Visit: Payer: Self-pay

## 2020-08-29 ENCOUNTER — Ambulatory Visit (HOSPITAL_COMMUNITY)
Admission: RE | Admit: 2020-08-29 | Discharge: 2020-08-29 | Disposition: A | Discharge status: Home / Self Care | Payer: Medicare Other | Attending: Cardiology | Admitting: Cardiology

## 2020-08-29 ENCOUNTER — Encounter (HOSPITAL_COMMUNITY)
Admission: RE | Disposition: A | Discharge status: Home / Self Care | Payer: Self-pay | Source: Home / Self Care | Attending: Cardiology

## 2020-08-29 DIAGNOSIS — Z7982 Long term (current) use of aspirin: Secondary | ICD-10-CM | POA: Diagnosis not present

## 2020-08-29 DIAGNOSIS — R0789 Other chest pain: Secondary | ICD-10-CM | POA: Insufficient documentation

## 2020-08-29 DIAGNOSIS — Z87891 Personal history of nicotine dependence: Secondary | ICD-10-CM | POA: Diagnosis not present

## 2020-08-29 DIAGNOSIS — R9439 Abnormal result of other cardiovascular function study: Secondary | ICD-10-CM | POA: Diagnosis present

## 2020-08-29 DIAGNOSIS — R002 Palpitations: Secondary | ICD-10-CM | POA: Diagnosis not present

## 2020-08-29 DIAGNOSIS — Z79899 Other long term (current) drug therapy: Secondary | ICD-10-CM | POA: Insufficient documentation

## 2020-08-29 DIAGNOSIS — E785 Hyperlipidemia, unspecified: Secondary | ICD-10-CM | POA: Diagnosis not present

## 2020-08-29 DIAGNOSIS — I2510 Atherosclerotic heart disease of native coronary artery without angina pectoris: Secondary | ICD-10-CM | POA: Insufficient documentation

## 2020-08-29 HISTORY — PX: LEFT HEART CATH AND CORONARY ANGIOGRAPHY: CATH118249

## 2020-08-29 SURGERY — LEFT HEART CATH AND CORONARY ANGIOGRAPHY
Anesthesia: LOCAL

## 2020-08-29 MED ORDER — HEPARIN (PORCINE) IN NACL 1000-0.9 UT/500ML-% IV SOLN
INTRAVENOUS | Status: AC
  Filled 2020-08-29: qty 1000

## 2020-08-29 MED ORDER — MIDAZOLAM HCL 2 MG/2ML IJ SOLN
INTRAMUSCULAR | Status: DC | PRN
  Administered 2020-08-29: 1 mg via INTRAVENOUS

## 2020-08-29 MED ORDER — VERAPAMIL HCL 2.5 MG/ML IV SOLN
INTRAVENOUS | Status: AC
  Filled 2020-08-29: qty 2

## 2020-08-29 MED ORDER — FENTANYL CITRATE (PF) 100 MCG/2ML IJ SOLN
INTRAMUSCULAR | Status: AC
  Filled 2020-08-29: qty 2

## 2020-08-29 MED ORDER — SODIUM CHLORIDE 0.9% FLUSH: 3.0000 mL | Freq: Two times a day (BID) | INTRAVENOUS | Status: DC

## 2020-08-29 MED ORDER — LIDOCAINE HCL (PF) 1 % IJ SOLN
INTRAMUSCULAR | Status: DC | PRN
  Administered 2020-08-29: 2 mL via INTRADERMAL

## 2020-08-29 MED ORDER — SODIUM CHLORIDE 0.9 % WEIGHT BASED INFUSION: 1.0000 mL/kg/h | INTRAVENOUS | Status: DC

## 2020-08-29 MED ORDER — FENTANYL CITRATE (PF) 100 MCG/2ML IJ SOLN
INTRAMUSCULAR | Status: DC | PRN
  Administered 2020-08-29: 25 ug via INTRAVENOUS

## 2020-08-29 MED ORDER — ONDANSETRON HCL 4 MG/2ML IJ SOLN
INTRAMUSCULAR | Status: AC
  Filled 2020-08-29: qty 2

## 2020-08-29 MED ORDER — VERAPAMIL HCL 2.5 MG/ML IV SOLN
INTRAVENOUS | Status: DC | PRN
  Administered 2020-08-29: 10 mL via INTRA_ARTERIAL

## 2020-08-29 MED ORDER — SODIUM CHLORIDE 0.9 % WEIGHT BASED INFUSION: 1.0000 mL/kg/h | INTRAVENOUS | Status: AC

## 2020-08-29 MED ORDER — ONDANSETRON HCL 4 MG/2ML IJ SOLN: 4.0000 mg | Freq: Four times a day (QID) | INTRAMUSCULAR | Status: DC | PRN

## 2020-08-29 MED ORDER — HEPARIN SODIUM (PORCINE) 1000 UNIT/ML IJ SOLN
INTRAMUSCULAR | Status: DC | PRN
  Administered 2020-08-29: 4500 [IU] via INTRAVENOUS

## 2020-08-29 MED ORDER — ACETAMINOPHEN 325 MG PO TABS: 650.0000 mg | ORAL_TABLET | ORAL | Status: DC | PRN

## 2020-08-29 MED ORDER — SODIUM CHLORIDE 0.9% FLUSH: 3.0000 mL | INTRAVENOUS | Status: DC | PRN

## 2020-08-29 MED ORDER — ASPIRIN 81 MG PO CHEW: 81.0000 mg | CHEWABLE_TABLET | ORAL | Status: DC

## 2020-08-29 MED ORDER — LIDOCAINE HCL (PF) 1 % IJ SOLN
INTRAMUSCULAR | Status: AC
  Filled 2020-08-29: qty 30

## 2020-08-29 MED ORDER — HEPARIN SODIUM (PORCINE) 1000 UNIT/ML IJ SOLN
INTRAMUSCULAR | Status: AC
  Filled 2020-08-29: qty 1

## 2020-08-29 MED ORDER — SODIUM CHLORIDE 0.9 % IV SOLN: 250.0000 mL | INTRAVENOUS | Status: DC | PRN

## 2020-08-29 MED ORDER — SODIUM CHLORIDE 0.9 % WEIGHT BASED INFUSION
3.0000 mL/kg/h | INTRAVENOUS | Status: AC
  Administered 2020-08-29: 3 mL/kg/h via INTRAVENOUS

## 2020-08-29 MED ORDER — HYDRALAZINE HCL 20 MG/ML IJ SOLN: 10.0000 mg | INTRAMUSCULAR | Status: DC | PRN

## 2020-08-29 MED ORDER — MIDAZOLAM HCL 2 MG/2ML IJ SOLN
INTRAMUSCULAR | Status: AC
  Filled 2020-08-29: qty 2

## 2020-08-29 MED ORDER — IOHEXOL 350 MG/ML SOLN
INTRAVENOUS | Status: DC | PRN
  Administered 2020-08-29: 40 mL

## 2020-08-29 MED ORDER — HEPARIN (PORCINE) IN NACL 1000-0.9 UT/500ML-% IV SOLN
INTRAVENOUS | Status: DC | PRN
  Administered 2020-08-29 (×2): 500 mL

## 2020-08-29 MED ORDER — SODIUM CHLORIDE 0.9 % IV SOLN
250.0000 mL | INTRAVENOUS | Status: DC | PRN
Start: 2020-08-29 — End: 2020-08-29

## 2020-08-29 SURGICAL SUPPLY — 10 items
CATH 5FR JL3.5 JR4 ANG PIG MP (CATHETERS) ×1 IMPLANT
DEVICE RAD COMP TR BAND LRG (VASCULAR PRODUCTS) ×1 IMPLANT
GLIDESHEATH SLEND SS 6F .021 (SHEATH) ×1 IMPLANT
GUIDEWIRE INQWIRE 1.5J.035X260 (WIRE) IMPLANT
INQWIRE 1.5J .035X260CM (WIRE) ×2
KIT HEART LEFT (KITS) ×2 IMPLANT
PACK CARDIAC CATHETERIZATION (CUSTOM PROCEDURE TRAY) ×2 IMPLANT
SYR MEDRAD MARK 7 150ML (SYRINGE) ×2 IMPLANT
TRANSDUCER W/STOPCOCK (MISCELLANEOUS) ×2 IMPLANT
TUBING CIL FLEX 10 FLL-RA (TUBING) ×2 IMPLANT

## 2020-08-29 NOTE — Interval H&P Note (Signed)
History and Physical Interval Note:  08/29/2020 1:41 PM  Kenneth Armstrong  has presented today for surgery, with the diagnosis of abnormal stress test - chest pain.  The various methods of treatment have been discussed with the patient and family. After consideration of risks, benefits and other options for treatment, the patient has consented to  Procedure(s): LEFT HEART CATH AND CORONARY ANGIOGRAPHY (N/A) as a surgical intervention.  The patient's history has been reviewed, patient examined, no change in status, stable for surgery.  I have reviewed the patient's chart and labs.  Questions were answered to the patient's satisfaction.   Cath Lab Visit (complete for each Cath Lab visit)  Clinical Evaluation Leading to the Procedure:   ACS: No.  Non-ACS:    Anginal Classification: CCS II  Anti-ischemic medical therapy: Minimal Therapy (1 class of medications)  Non-Invasive Test Results: Low-risk stress test findings: cardiac mortality <1%/year  Prior CABG: No previous CABG        Theron Arista [facility] 08/29/2020 1:41 PM

## 2020-08-29 NOTE — Research (Signed)
IDENTIFY Informed Consent   Subject Name: Kenneth Armstrong  Subject met inclusion and exclusion criteria.  The informed consent form, study requirements and expectations were reviewed with the subject and questions and concerns were addressed prior to the signing of the consent form.  The subject verbalized understanding of the trail requirements.  The subject agreed to participate in the IDENTIFY trial and signed the informed consent.  The informed consent was obtained prior to performance of any protocol-specific procedures for the subject.  A copy of the signed informed consent was given to the subject and a copy was placed in the subject's medical record.  Mena Goes. 08/29/2020, 09:40 am

## 2020-08-29 NOTE — Discharge Instructions (Signed)
Radial Site Care  This sheet gives you information about how to care for yourself after your procedure. Your health care provider may also give you more specific instructions. If you have problems or questions, contact your health care provider. What can I expect after the procedure? After the procedure, it is common to have:  Bruising and tenderness at the catheter insertion area. Follow these instructions at home: Medicines  Take over-the-counter and prescription medicines only as told by your health care provider. Insertion site care  Follow instructions from your health care provider about how to take care of your insertion site. Make sure you: ? Wash your hands with soap and water before you change your bandage (dressing). If soap and water are not available, use hand sanitizer. ? Change your dressing as told by your health care provider. ? Leave stitches (sutures), skin glue, or adhesive strips in place. These skin closures may need to stay in place for 2 weeks or longer. If adhesive strip edges start to loosen and curl up, you may trim the loose edges. Do not remove adhesive strips completely unless your health care provider tells you to do that.  Check your insertion site every day for signs of infection. Check for: ? Redness, swelling, or pain. ? Fluid or blood. ? Pus or a bad smell. ? Warmth.  Do not take baths, swim, or use a hot tub until your health care provider approves.  You may shower 24-48 hours after the procedure, or as directed by your health care provider. ? Remove the dressing and gently wash the site with plain soap and water. ? Pat the area dry with a clean towel. ? Do not rub the site. That could cause bleeding.  Do not apply powder or lotion to the site. Activity   For 24 hours after the procedure, or as directed by your health care provider: ? Do not flex or bend the affected arm. ? Do not push or pull heavy objects with the affected arm. ? Do not  drive yourself home from the hospital or clinic. You may drive 24 hours after the procedure unless your health care provider tells you not to. ? Do not operate machinery or power tools.  Do not lift anything that is heavier than 10 lb (4.5 kg), or the limit that you are told, until your health care provider says that it is safe.  Ask your health care provider when it is okay to: ? Return to work or school. ? Resume usual physical activities or sports. ? Resume sexual activity. General instructions  If the catheter site starts to bleed, raise your arm and put firm pressure on the site. If the bleeding does not stop, get help right away. This is a medical emergency.  If you went home on the same day as your procedure, a responsible adult should be with you for the first 24 hours after you arrive home.  Keep all follow-up visits as told by your health care provider. This is important. Contact a health care provider if:  You have a fever.  You have redness, swelling, or yellow drainage around your insertion site. Get help right away if:  You have unusual pain at the radial site.  The catheter insertion area swells very fast.  The insertion area is bleeding, and the bleeding does not stop when you hold steady pressure on the area.  Your arm or hand becomes pale, cool, tingly, or numb. These symptoms may represent a serious problem   that is an emergency. Do not wait to see if the symptoms will go away. Get medical help right away. Call your local emergency services (911 in the [place] Do not drive yourself to the hospital. Summary  After the procedure, it is common to have bruising and tenderness at the site.  Follow instructions from your health care provider about how to take care of your radial site wound. Check the wound every day for signs of infection.  Do not lift anything that is heavier than 10 lb (4.5 kg), or the limit that you are told, until your health care provider says  that it is safe. This information is not intended to replace advice given to you by your health care provider. Make sure you discuss any questions you have with your health care provider. Document Revised: 09/15/2017 Document Reviewed: 09/15/2017 Elsevier Patient Education  2020 [employer]vier [employer]  

## 2020-08-30 ENCOUNTER — Encounter (HOSPITAL_COMMUNITY): Payer: Self-pay | Admitting: Cardiology

## 2020-08-31 ENCOUNTER — Other Ambulatory Visit: Payer: Self-pay | Admitting: Cardiology

## 2020-10-08 ENCOUNTER — Other Ambulatory Visit: Payer: Self-pay

## 2020-10-08 ENCOUNTER — Ambulatory Visit: Payer: Medicare Other | Admitting: Cardiology

## 2020-10-08 ENCOUNTER — Encounter: Payer: Self-pay | Admitting: Cardiology

## 2020-10-08 VITALS — BP 110/72 | HR 62 | Ht 71.5 in | Wt 190.0 lb

## 2020-10-08 DIAGNOSIS — E785 Hyperlipidemia, unspecified: Secondary | ICD-10-CM

## 2020-10-08 DIAGNOSIS — R9439 Abnormal result of other cardiovascular function study: Secondary | ICD-10-CM

## 2020-10-08 DIAGNOSIS — I2510 Atherosclerotic heart disease of native coronary artery without angina pectoris: Secondary | ICD-10-CM | POA: Diagnosis not present

## 2020-10-08 DIAGNOSIS — R002 Palpitations: Secondary | ICD-10-CM | POA: Diagnosis not present

## 2020-10-08 NOTE — Addendum Note (Signed)
Addended by: Senaida Ores on: 10/08/2020 02:11 PM   Modules accepted: Orders

## 2020-10-08 NOTE — Progress Notes (Signed)
Cardiology Office Note:    Date:  10/08/2020   ID:  Kenneth Armstrong, Kenneth Armstrong 29-Aug-1940, MRN [id number]  PCP:  Cyndi Bender, PA-C  Cardiologist:  Jenne Campus, MD    Referring MD: Cyndi Bender, PA-C   Chief Complaint  Patient presents with  . Follow-up  I am doing well  History of Present Illness:    ARIEN Armstrong is a 80 y.o. male with past medical history significant for essential hypertension, dyslipidemia with calculated predicted 10 years risk of 25%, initially referred to [place] because of palpitations however also started complaining having some pain in the right shoulder.  He wanted to be a little more aggressive with his exercise but was afraid to do it because of pain in the right shoulder.  And of having Lexiscan showing ischemia involving midportion of the inferior wall.  Cardiac catheterization be done after that which likely show up to 2530% narrowing in different arteries.  He comes today to my office to discuss those issues.  Overall he said he is feeling better palpitations are not bothering him he is back to exercises aggressively have no difficulty doing it overall feels dramatically better still complain of having some shoulder pain as well as some pain in first 2 fingers in his arm.  Past Medical History:  Diagnosis Date  . Abnormal stress test 08/09/2020  . Arthritis    LUMBAR  . Atypical chest pain 06/14/2020  . BPH (benign prostatic hyperplasia)   . Dizziness 06/14/2020  . Dyslipidemia 06/14/2020  . GERD (gastroesophageal reflux disease)   . Hiatal hernia   . History of palpitations   . Hx of Lyme disease   . Lower urinary tract symptoms (LUTS)   . Palpitations 06/14/2020  . Wears glasses     Past Surgical History:  Procedure Laterality Date  . CARDIOVASCULAR STRESS TEST  12/05/2008   normal nuclear study w/ no ischemia/  normal LV function and wall motion , ef 70%  . CATARACT EXTRACTION W/PHACO Right 12/19/2015   Procedure: CATARACT EXTRACTION PHACO AND  INTRAOCULAR LENS PLACEMENT (I[facility]);  Surgeon: Leandrew Koyanagi, MD;  Location: [facility];  Service: Ophthalmology;  Laterality: Right;US 40.0AP%7.6CDE3.03FluidPackLot # [id number]  . COLONOSCOPY    . INGUINAL HERNIA REPAIR Right early 2000's  . LEFT HEART CATH AND CORONARY ANGIOGRAPHY N/A 08/29/2020   Procedure: LEFT HEART CATH AND CORONARY ANGIOGRAPHY;  Surgeon: Martinique, Peter M, MD;  Location: [place] [facility];  Service: Cardiovascular;  Laterality: N/A;  . PILONIDAL CYST / SINUS EXCISION  1960's  . TONSILLECTOMY  1949  . VARICOSE VEIN SURGERY  1950's and 1970's    Current Medications: Current Meds  Medication Sig  . acetaminophen (TYLENOL) 500 MG tablet Take 1,000 mg by mouth every 6 (six) hours as needed for headache.  Marland Kitchen aspirin EC 81 MG tablet Take 1 tablet (81 mg total) by mouth daily.  Marland Kitchen atorvastatin (LIPITOR) 10 MG tablet Take 1 tablet (10 mg total) by mouth daily.  Marland Kitchen ibuprofen (ADVIL,MOTRIN) 200 MG tablet Take 400 mg by mouth daily as needed for mild pain or moderate pain (Back pain).  . Multiple Vitamin (MULITIVITAMIN WITH MINERALS) TABS Take 1 tablet by mouth daily.  . nitroGLYCERIN (NITROSTAT) 0.4 MG SL tablet Place 1 tablet (0.4 mg total) under the tongue every 5 (five) minutes as needed. (Patient taking differently: Place 0.4 mg under the tongue every 5 (five) minutes as needed for chest pain.)  . omeprazole (PRILOSEC) 20 MG capsule Take 20 mg by mouth  every morning.   . ranolazine (RANEXA) 500 MG 12 hr tablet TAKE 1 TABLET BY MOUTH TWICE A DAY     Allergies:   Patient has no known allergies.   Social History   Socioeconomic History  . Marital status: Married    Spouse name: Not on file  . Number of children: Not on file  . Years of education: Not on file  . Highest education level: Not on file  Occupational History  . Occupation: retired  Tobacco Use  . Smoking status: Former Smoker    Packs/day: 1.00    Years: 10.00    Pack years: 10.00    Quit date: 08/24/1968     Years since quitting: 52.1  . Smokeless tobacco: Never Used  Substance and Sexual Activity  . Alcohol use: Yes    Comment: occasional   . Drug use: No  . Sexual activity: Not on file  Other Topics Concern  . Not on file  Social History Narrative  . Not on file   Social Determinants of Health   Financial Resource Strain: Not on file  Food Insecurity: Not on file  Transportation Needs: Not on file  Physical Activity: Not on file  Stress: Not on file  Social Connections: Not on file     Family History: The patient's family history includes Colon cancer in his brother; Coronary artery disease in his father; Hypertension in his mother; Lung cancer in his father. ROS:   Please see the history of present illness.    All 14 point review of systems negative except as described per history of present illness  EKGs/Labs/Other Studies Reviewed:    Cardiac catheterization from January 2022 showed:  Prox LAD to Mid LAD lesion is 25% stenosed.  1st Diag lesion is 30% stenosed.  Mid Cx to Dist Cx lesion is 25% stenosed.  Prox RCA to Mid RCA lesion is 20% stenosed.  LV end diastolic pressure is normal.    Recent Labs: 08/21/2020: BUN 20; Creatinine, Ser 1.19; Hemoglobin 15.9; Platelets 281; Potassium 4.6; Sodium 142  Recent Lipid Panel No results found for: CHOL, TRIG, HDL, CHOLHDL, VLDL, LDLCALC, LDLDIRECT  Physical Exam:    VS:  BP 110/72 (BP Location: Right Arm, Patient Position: Sitting)   Pulse 62   Ht 5' 11.5" (1.816 m)   Wt 190 lb (86.2 kg)   SpO2 97%   BMI 26.13 kg/m     Wt Readings from Last 3 Encounters:  10/08/20 190 lb (86.2 kg)  08/29/20 185 lb (83.9 kg)  08/21/20 191 lb (86.6 kg)     GEN:  Well nourished, well developed in no acute distress HEENT: Normal NECK: No JVD; No carotid bruits LYMPHATICS: No lymphadenopathy CARDIAC: RRR, no murmurs, no rubs, no gallops RESPIRATORY:  Clear to auscultation without rales, wheezing or rhonchi  ABDOMEN: Soft,  non-tender, non-distended MUSCULOSKELETAL:  No edema; No deformity  SKIN: Warm and dry LOWER EXTREMITIES: no swelling NEUROLOGIC:  Alert and oriented x 3 PSYCHIATRIC:  Normal affect   ASSESSMENT:    1. Abnormal stress test   2. Coronary artery disease involving native coronary artery of native heart without angina pectoris   3. Palpitations   4. Dyslipidemia    PLAN:    In order of problems listed above:  1. Abnormal stress test showing inferior ischemia however cardiac catheterization after that showed nonobstructive disease.  Overall doing well from that point review is constipated with ranolazine diet and feeling better with the medication I simply asked  him to stop that medication.  We still need to continue risk factors modifications which include aspirin 81 daily as an antiplatelet agent as well as treatment of dyslipidemia with his high risk of 25%.  Therefore, I will schedule him to have fasting lipid profile done, ask him to stop ranolazine continue aspirin. 2. Palpitations: Denies having any overall doing well from that point review. 3. Dyslipidemia I did review his K PN from June 03, 2020 showing LDL of 106 and HDL of 67.  We will check another fasting lipid profile and probably will augment his therapy so far he is taking only 10 mg of Lipitor. 4. We did talk about healthy lifestyle which he already does. 5. Status post cardiac catheterization right wrist healed well without any complications.   Medication Adjustments/Labs and Tests Ordered: Current medicines are reviewed at length with the patient today.  Concerns regarding medicines are outlined above.  No orders of the defined types were placed in this encounter.  Medication changes: No orders of the defined types were placed in this encounter.   Signed, Park Liter, MD, [facility] 10/08/2020 1:59 PM    [place]

## 2020-10-08 NOTE — Patient Instructions (Signed)
Medication Instructions:  Your physician has recommended you make the following change in your medication:   STOP: Ranolazine  *If you need a refill on your cardiac medications before your next appointment, please call your pharmacy*   Lab Work: Your physician recommends that you return for lab work when fasting: lipid   If you have labs (blood work) drawn today and your tests are completely normal, you will receive your results only by: Marland Kitchen MyChart Message (if you have MyChart) OR . A paper copy in the mail If you have any lab test that is abnormal or we need to change your treatment, we will call you to review the results.   Testing/Procedures: None.   Follow-Up: At [facility], you and your health needs are our priority.  As part of our continuing mission to provide you with exceptional heart care, we have created designated Provider Care Teams.  These Care Teams include your primary Cardiologist (physician) and Advanced Practice Providers (APPs -  Physician Assistants and Nurse Practitioners) who all work together to provide you with the care you need, when you need it.  We recommend signing up for the patient portal called "MyChart".  Sign up information is provided on this After Visit Summary.  MyChart is used to connect with patients for Virtual Visits (Telemedicine).  Patients are able to view lab/test results, encounter notes, upcoming appointments, etc.  Non-urgent messages can be sent to your provider as well.   To learn more about what you can do with MyChart, go to [email or web address].    Your next appointment:   5 month(s)  The format for your next appointment:   In Person  Provider:   Jenne Campus, MD   Other Instructions

## 2020-10-11 LAB — LIPID PANEL
Chol/HDL Ratio: 2.1 ratio (ref 0.0–5.0)
Cholesterol, Total: 157 mg/dL (ref 100–199)
HDL: 74 mg/dL (ref >39)
LDL Chol Calc (NIH): 69 mg/dL (ref 0–99)
Triglycerides: 70 mg/dL (ref 0–149)
VLDL Cholesterol Cal: 14 mg/dL (ref 5–40)

## 2020-11-26 ENCOUNTER — Ambulatory Visit: Payer: Medicare Other | Admitting: Cardiology

## 2021-01-17 ENCOUNTER — Other Ambulatory Visit: Payer: Self-pay | Admitting: Cardiology

## 2021-01-17 NOTE — Telephone Encounter (Signed)
Rx approved and sent 

## 2021-03-11 ENCOUNTER — Ambulatory Visit: Payer: Medicare Other | Admitting: Cardiology

## 2021-03-11 ENCOUNTER — Other Ambulatory Visit: Payer: Self-pay

## 2021-03-11 ENCOUNTER — Encounter: Payer: Self-pay | Admitting: Cardiology

## 2021-03-11 VITALS — BP 122/70 | HR 63 | Ht 71.0 in | Wt 189.0 lb

## 2021-03-11 DIAGNOSIS — R0789 Other chest pain: Secondary | ICD-10-CM

## 2021-03-11 DIAGNOSIS — E785 Hyperlipidemia, unspecified: Secondary | ICD-10-CM | POA: Diagnosis not present

## 2021-03-11 DIAGNOSIS — R002 Palpitations: Secondary | ICD-10-CM

## 2021-03-11 DIAGNOSIS — I2510 Atherosclerotic heart disease of native coronary artery without angina pectoris: Secondary | ICD-10-CM

## 2021-03-11 NOTE — Patient Instructions (Signed)

## 2021-03-11 NOTE — Progress Notes (Signed)
Cardiology Office Note:    Date:  03/11/2021   ID:  Plato, Alspaugh 01-21-1941, MRN [id number]  PCP:  Cyndi Bender, PA-C  Cardiologist:  Jenne Campus, MD    Referring MD: Cyndi Bender, PA-C   Chief Complaint  Patient presents with   Follow-up  Doing well but still have some palpitations  History of Present Illness:    Kenneth Armstrong is a 80 y.o. male with past medical history significant for coronary artery disease.  He did have a stress test done which showed ischemia involving mid portion of the inferior wall, after that cardiac catheterization has been performed which showed 25 to 30% narrowing in different arteries.  He also got history of essential hypertension as well as dyslipidemia. He comes today to my office for follow-up most of the time he is doing well.  He denies have any chest pain tightness squeezing pressure burning chest.  He is trying to be active this he describes stressful situation at home.  Still sob some palpitations which sometimes bother him.  Past Medical History:  Diagnosis Date   Abnormal stress test 08/09/2020   Arthritis    LUMBAR   Atypical chest pain 06/14/2020   BPH (benign prostatic hyperplasia)    Dizziness 06/14/2020   Dyslipidemia 06/14/2020   GERD (gastroesophageal reflux disease)    Hiatal hernia    History of palpitations    Hx of Lyme disease    Lower urinary tract symptoms (LUTS)    Palpitations 06/14/2020   Wears glasses     Past Surgical History:  Procedure Laterality Date   CARDIOVASCULAR STRESS TEST  12/05/2008   normal nuclear study w/ no ischemia/  normal LV function and wall motion , ef 70%   CATARACT EXTRACTION W/PHACO Right 12/19/2015   Procedure: CATARACT EXTRACTION PHACO AND INTRAOCULAR LENS PLACEMENT ([place]);  Surgeon: Leandrew Koyanagi, MD;  Location: [facility];  Service: Ophthalmology;  Laterality: Right;US 40.0AP%7.6CDE3.03FluidPackLot # [id number] H   COLONOSCOPY     INGUINAL HERNIA REPAIR Right early 2000's    LEFT HEART CATH AND CORONARY ANGIOGRAPHY N/A 08/29/2020   Procedure: LEFT HEART CATH AND CORONARY ANGIOGRAPHY;  Surgeon: Martinique, Peter M, MD;  Location: [place] [facility];  Service: Cardiovascular;  Laterality: N/A;   PILONIDAL CYST / SINUS EXCISION  1960's   TONSILLECTOMY  1949   VARICOSE VEIN SURGERY  1950's and 1970's    Current Medications: Current Meds  Medication Sig   acetaminophen (TYLENOL) 500 MG tablet Take 1,000 mg by mouth every 6 (six) hours as needed for headache.   aspirin EC 81 MG tablet Take 1 tablet (81 mg total) by mouth daily.   atorvastatin (LIPITOR) 10 MG tablet TAKE 1 TABLET BY MOUTH EVERY DAY (Patient taking differently: Take 10 mg by mouth daily.)   ibuprofen (ADVIL,MOTRIN) 200 MG tablet Take 400 mg by mouth daily as needed for mild pain or moderate pain (Back pain).   Multiple Vitamin (MULITIVITAMIN WITH MINERALS) TABS Take 1 tablet by mouth daily. Unknown strength   nitroGLYCERIN (NITROSTAT) 0.4 MG SL tablet Place 1 tablet (0.4 mg total) under the tongue every 5 (five) minutes as needed. (Patient taking differently: Place 0.4 mg under the tongue every 5 (five) minutes as needed for chest pain.)   omeprazole (PRILOSEC) 20 MG capsule Take 20 mg by mouth every morning.      Allergies:   Patient has no known allergies.   Social History   Socioeconomic History   Marital status: Married  Spouse name: Not on file   Number of children: Not on file   Years of education: Not on file   Highest education level: Not on file  Occupational History   Occupation: retired  Tobacco Use   Smoking status: Former    Packs/day: 1.00    Years: 10.00    Pack years: 10.00    Types: Cigarettes    Quit date: 08/24/1968    Years since quitting: 52.5   Smokeless tobacco: Never  Substance and Sexual Activity   Alcohol use: Yes    Comment: occasional    Drug use: No   Sexual activity: Not on file  Other Topics Concern   Not on file  Social History Narrative   Not on file    Social Determinants of Health   Financial Resource Strain: Not on file  Food Insecurity: Not on file  Transportation Needs: Not on file  Physical Activity: Not on file  Stress: Not on file  Social Connections: Not on file     Family History: The patient's family history includes Colon cancer in his brother; Coronary artery disease in his father; Hypertension in his mother; Lung cancer in his father. ROS:   Please see the history of present illness.    All 14 point review of systems negative except as described per history of present illness  EKGs/Labs/Other Studies Reviewed:      Recent Labs: 08/21/2020: BUN 20; Creatinine, Ser 1.19; Hemoglobin 15.9; Platelets 281; Potassium 4.6; Sodium 142  Recent Lipid Panel    Component Value Date/Time   CHOL 157 10/11/2020 0843   TRIG 70 10/11/2020 0843   HDL 74 10/11/2020 0843   CHOLHDL 2.1 10/11/2020 0843   LDLCALC 69 10/11/2020 0843    Physical Exam:    VS:  BP 122/70 (BP Location: Left Arm, Patient Position: Sitting)   Pulse 63   Ht 5\' 11"  (1.803 m)   Wt 189 lb (85.7 kg)   SpO2 98%   BMI 26.36 kg/m     Wt Readings from Last 3 Encounters:  03/11/21 189 lb (85.7 kg)  10/08/20 190 lb (86.2 kg)  08/29/20 185 lb (83.9 kg)     GEN:  Well nourished, well developed in no acute distress HEENT: Normal NECK: No JVD; No carotid bruits LYMPHATICS: No lymphadenopathy CARDIAC: RRR, no murmurs, no rubs, no gallops RESPIRATORY:  Clear to auscultation without rales, wheezing or rhonchi  ABDOMEN: Soft, non-tender, non-distended MUSCULOSKELETAL:  No edema; No deformity  SKIN: Warm and dry LOWER EXTREMITIES: no swelling NEUROLOGIC:  Alert and oriented x 3 PSYCHIATRIC:  Normal affect   ASSESSMENT:    1. Coronary artery disease involving native coronary artery of native heart without angina pectoris   2. Atypical chest pain   3. Palpitations   4. Dyslipidemia    PLAN:    In order of problems listed above:  Coronary artery  disease 25 to 30% narrowing multiple arteries none of this is obstructive and symptomatic.  Continue risk factors modification with aspirin as well as statin. Essential hypertension blood pressure well controlled continue present management. Dyslipidemia I did review his K PN which show me his LDL of 69 HDL 74 this is from February 2022.  We will continue present management which include moderate intensity statin form of Lipitor 10. Palpitations we had a long discussion about that.  Monitor shows some short episode of supraventricular tachycardia I did talk about potential treatment for it he is not interested however I told him if  he does become bothersome he needs to let me know then we will be able to use small dose of beta-blocker to help with this.   Medication Adjustments/Labs and Tests Ordered: Current medicines are reviewed at length with the patient today.  Concerns regarding medicines are outlined above.  No orders of the defined types were placed in this encounter.  Medication changes: No orders of the defined types were placed in this encounter.   Signed, Park Liter, MD, [facility] 03/11/2021 9:49 AM    [place]

## 2021-05-02 NOTE — Addendum Note (Signed)
Addended by: Resa Miner I on: 05/02/2021 08:24 AM   Modules accepted: Orders

## 2021-07-19 ENCOUNTER — Other Ambulatory Visit: Payer: Self-pay | Admitting: Cardiology

## 2021-12-04 ENCOUNTER — Encounter: Payer: Self-pay | Admitting: Cardiology

## 2021-12-04 ENCOUNTER — Ambulatory Visit: Payer: Medicare Other | Admitting: Cardiology

## 2021-12-04 VITALS — BP 118/68 | HR 67 | Ht 71.0 in | Wt 192.4 lb

## 2021-12-04 DIAGNOSIS — R0789 Other chest pain: Secondary | ICD-10-CM

## 2021-12-04 DIAGNOSIS — E785 Hyperlipidemia, unspecified: Secondary | ICD-10-CM

## 2021-12-04 DIAGNOSIS — K449 Diaphragmatic hernia without obstruction or gangrene: Secondary | ICD-10-CM | POA: Diagnosis not present

## 2021-12-04 DIAGNOSIS — R002 Palpitations: Secondary | ICD-10-CM

## 2021-12-04 DIAGNOSIS — I2510 Atherosclerotic heart disease of native coronary artery without angina pectoris: Secondary | ICD-10-CM

## 2021-12-04 NOTE — Progress Notes (Signed)
?Cardiology Office Note:   ? ?Date:  12/04/2021  ? ?ID:  Kenneth Armstrong, DOB [birth year], MRN [id number] ? ?PCP:  Cyndi Bender, PA-C  ?Cardiologist:  Jenne Campus, MD   ? ?Referring MD: Cyndi Bender, PA-C  ? ?Chief Complaint  ?Patient presents with  ? Follow-up  ?I am doing well ? ?History of Present Illness:   ? ?Kenneth Armstrong is a 81 y.o. male with past medical history significant for coronary artery disease, cardiac catheterization January 2020 showed 25% mid LAD, 30% diagonal branch 25% mid circumflex 20% RCA.  Also essential hypertension, dyslipidemia.  Comes today to my office for follow-up.  Overall he is doing very well.  He denies have any chest pain tightness squeezing pressure burning chest no palpitations dizziness swelling of lower extremities.  He is very active he does exercise equipment and do exercises at home however complaining of having some problems with his ankle and is planning to go to orthopedic doctor.  Today he is planning some tomato and he is looking forward to it. ? ?Past Medical History:  ?Diagnosis Date  ? Abnormal stress test 08/09/2020  ? 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 Atypical chest pain 06/14/2020  ? BPH (benign prostatic hyperplasia)   ? Dizziness 06/14/2020  ? Dyslipidemia 06/14/2020  ? GERD (gastroesophageal reflux disease)   ? Hiatal hernia   ? History of palpitations   ? Hx of Lyme disease   ? Lower urinary tract symptoms (LUTS)   ? Palpitations 06/14/2020  ? Wears glasses   ? ? ?Past Surgical History:  ?Procedure Laterality Date  ? CARDIOVASCULAR STRESS TEST  12/05/2008  ? normal nuclear study w/ no ischemia/  normal LV function and wall motion , ef 70%  ? CATARACT EXTRACTION W/PHACO Right 12/19/2015  ? Procedure: CATARACT EXTRACTION PHACO AND INTRAOCULAR LENS PLACEMENT (IOC);  Surgeon: Leandrew Koyanagi, MD;  Location: [facility];  Service: Ophthalmology;  Laterality: Right;US 40.0AP%7.6CDE3.03FluidPackLot # [id number]  ? COLONOSCOPY    ? INGUINAL HERNIA REPAIR Right early 2000's   ? LEFT HEART CATH AND CORONARY ANGIOGRAPHY N/A 08/29/2020  ? Procedure: LEFT HEART CATH AND CORONARY ANGIOGRAPHY;  Surgeon: Martinique, Peter M, MD;  Location: [place] [facility];  Service: Cardiovascular;  Laterality: N/A;  ? PILONIDAL CYST / SINUS EXCISION  1960's  ? TONSILLECTOMY  1949  ? VARICOSE VEIN SURGERY  1950's and 1970's  ? ? ?Current Medications: ?Current Meds  ?Medication Sig  ? acetaminophen (TYLENOL) 500 MG tablet Take 1,000 mg by mouth every 6 (six) hours as needed for headache.  ? aspirin EC 81 MG tablet Take 1 tablet (81 mg total) by mouth daily.  ? atorvastatin (LIPITOR) 10 MG tablet TAKE 1 TABLET BY MOUTH EVERY DAY (Patient taking differently: Take 10 mg by mouth daily.)  ? ibuprofen (ADVIL,MOTRIN) 200 MG tablet Take 400 mg by mouth daily as needed for mild pain or moderate pain (Back pain).  ? Multiple Vitamin (MULITIVITAMIN WITH MINERALS) TABS Take 1 tablet by mouth daily. Unknown strength  ? nitroGLYCERIN (NITROSTAT) 0.4 MG SL tablet Place 1 tablet (0.4 mg total) under the tongue every 5 (five) minutes as needed. (Patient taking differently: Place 0.4 mg under the tongue every 5 (five) minutes as needed for chest pain.)  ? omeprazole (PRILOSEC) 20 MG capsule Take 20 mg by mouth every morning.   ?  ? ?Allergies:   Patient has no known allergies.  ? ?Social History  ? ?Socioeconomic History  ? Marital status: Married  ?  Spouse name: Not on file  ? Number of children: Not on file  ? Years of education: Not on file  ? Highest education level: Not on file  ?Occupational History  ? Occupation: retired  ?Tobacco Use  ? Smoking status: Former  ?  Packs/day: 1.00  ?  Years: 10.00  ?  Pack years: 10.00  ?  Types: Cigarettes  ?  Quit date: 08/24/1968  ?  Years since quitting: 53.3  ? Smokeless tobacco: Never  ?Substance and Sexual Activity  ? Alcohol use: Yes  ?  Comment: occasional   ? Drug use: No  ? Sexual activity: Not on file  ?Other Topics Concern  ? Not on file  ?Social History Narrative  ? Not on file   ? ?Social Determinants of Health  ? ?Financial Resource Strain: Not on file  ?Food Insecurity: Not on file  ?Transportation Needs: Not on file  ?Physical Activity: Not on file  ?Stress: Not on file  ?Social Connections: Not on file  ?  ? ?Family History: ?The patient's family history includes Colon cancer in his brother; Coronary artery disease in his father; Hypertension in his mother; Lung cancer in his father. ?ROS:   ?Please see the history of present illness.    ?All 14 point review of systems negative except as described per history of present illness ? ?EKGs/Labs/Other Studies Reviewed:   ? ? ? ?Recent Labs: ?No results found for requested labs within last 8760 hours.  ?Recent Lipid Panel ?   ?Component Value Date/Time  ? CHOL 157 10/11/2020 0843  ? TRIG 70 10/11/2020 0843  ? HDL 74 10/11/2020 0843  ? CHOLHDL 2.1 10/11/2020 0843  ? [place] 69 10/11/2020 0843  ? ? ?Physical Exam:   ? ?VS:  BP 118/68 (BP Location: Left Arm, Patient Position: Sitting)   Pulse 67   Ht '5\' 11"'$  (1.803 m)   Wt 192 lb 6.4 oz (87.3 kg)   SpO2 96%   BMI 26.83 kg/m?    ? ?Wt Readings from Last 3 Encounters:  ?12/04/21 192 lb 6.4 oz (87.3 kg)  ?03/11/21 189 lb (85.7 kg)  ?10/08/20 190 lb (86.2 kg)  ?  ? ?GEN:  Well nourished, well developed in no acute distress ?HEENT: Normal ?NECK: No JVD; No carotid bruits ?LYMPHATICS: No lymphadenopathy ?CARDIAC: RRR, no murmurs, no rubs, no gallops ?RESPIRATORY:  Clear to auscultation without rales, wheezing or rhonchi  ?ABDOMEN: Soft, non-tender, non-distended ?MUSCULOSKELETAL:  No edema; No deformity  ?SKIN: Warm and dry ?LOWER EXTREMITIES: no swelling ?NEUROLOGIC:  Alert and oriented x 3 ?PSYCHIATRIC:  Normal affect  ? ?ASSESSMENT:   ? ?1. Coronary artery disease involving native coronary artery of native heart without angina pectoris   ?2. Hiatal hernia   ?3. Dyslipidemia   ?4. Palpitations   ?5. Atypical chest pain   ? ?PLAN:   ? ?In order of problems listed above: ? ?Coronary disease  stable asymptomatic on appropriate medication which include antiplatelets therapy as well as statin. ?Dyslipidemia I did review K PN which show me his LDL 69 HDL 74 appropriate control he is on moderate intense statin form of Lipitor 10 which I will continue. ?Palpitations denies having any ?Chest pain denies having any. ?We did talk about healthy lifestyle need to exercise on the regular basis which he will do. ? ? ?Medication Adjustments/Labs and Tests Ordered: ?Current medicines are reviewed at length with the patient today.  Concerns regarding medicines are outlined above.  ?No orders of the defined  types were placed in this encounter. ? ?Medication changes: No orders of the defined types were placed in this encounter. ? ? ?Signed, ?Park Liter, MD, [facility] ?12/04/2021 2:14 PM    ?[place] ?

## 2021-12-04 NOTE — Patient Instructions (Signed)

## 2022-01-15 ENCOUNTER — Other Ambulatory Visit: Payer: Self-pay | Admitting: Cardiology

## 2022-04-21 ENCOUNTER — Other Ambulatory Visit: Payer: Self-pay | Admitting: Cardiology

## 2022-04-21 NOTE — Telephone Encounter (Signed)
Rx refill sent to pharmacy. 

## 2022-07-01 ENCOUNTER — Ambulatory Visit: Payer: Medicare Other | Admitting: Dermatology

## 2022-07-01 DIAGNOSIS — L578 Other skin changes due to chronic exposure to nonionizing radiation: Secondary | ICD-10-CM | POA: Diagnosis not present

## 2022-07-01 DIAGNOSIS — L821 Other seborrheic keratosis: Secondary | ICD-10-CM

## 2022-07-01 DIAGNOSIS — L720 Epidermal cyst: Secondary | ICD-10-CM | POA: Diagnosis not present

## 2022-07-01 DIAGNOSIS — D229 Melanocytic nevi, unspecified: Secondary | ICD-10-CM

## 2022-07-01 DIAGNOSIS — Z1283 Encounter for screening for malignant neoplasm of skin: Secondary | ICD-10-CM

## 2022-07-01 DIAGNOSIS — L820 Inflamed seborrheic keratosis: Secondary | ICD-10-CM

## 2022-07-01 DIAGNOSIS — D3617 Benign neoplasm of peripheral nerves and autonomic nervous system of trunk, unspecified: Secondary | ICD-10-CM

## 2022-07-01 DIAGNOSIS — L814 Other melanin hyperpigmentation: Secondary | ICD-10-CM

## 2022-07-01 DIAGNOSIS — D369 Benign neoplasm, unspecified site: Secondary | ICD-10-CM

## 2022-07-01 DIAGNOSIS — D692 Other nonthrombocytopenic purpura: Secondary | ICD-10-CM

## 2022-07-01 MED ORDER — PIMECROLIMUS 1 % EX CREA: TOPICAL_CREAM | CUTANEOUS | 0 refills | Status: AC

## 2022-07-01 NOTE — Progress Notes (Signed)
New Patient Visit  Subjective  Kenneth Armstrong is a 81 y.o. male who presents for the following: Annual Exam. The patient presents for Total-Body Skin Exam (TBSE) for skin cancer screening and mole check.  The patient has spots, moles and lesions to be evaluated, some may be new or changing and the patient has concerns that these could be cancer. Patient c/o itchy area in the groin area.  The following portions of the chart were reviewed this encounter and updated as appropriate:   Tobacco  Allergies  Meds  Problems  Med Hx  Surg Hx  Fam Hx     Review of Systems:  No other skin or systemic complaints except as noted in HPI or Assessment and Plan.  Objective  Well appearing patient in no apparent distress; mood and affect are within normal limits.  A full examination was performed including scalp, head, eyes, ears, nose, lips, neck, chest, axillae, abdomen, back, buttocks, bilateral upper extremities, bilateral lower extremities, hands, feet, fingers, toes, fingernails, and toenails. All findings within normal limits unless otherwise noted below.  scrotum Purple patches   right temple x 1, right mandible x 1, left lateral vertex scalp x 1  (3) (3) Stuck-on, waxy, tan-brown papules--Discussed benign etiology and prognosis.   left lateral vertex scalp 1 cm Subcutaneous nodule.    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  Destruction of lesion - right temple x 1, right mandible x 1, left lateral vertex scalp x 1  (3) Complexity: simple   Destruction  method: cryotherapy   Informed consent: discussed and consent obtained   Timeout:  patient name, date of birth, surgical site, and procedure verified Lesion destroyed using liquid nitrogen: Yes   Region frozen until ice ball extended beyond lesion: Yes   Outcome: patient tolerated procedure well with no complications   Post-procedure details: wound care instructions given    Epidermal inclusion cyst left lateral vertex scalp Benign-appearing. Exam most consistent with an epidermal inclusion cyst. Discussed that a cyst is a benign growth that can grow over time and sometimes get irritated or inflamed. Recommend observation if it is not bothersome. Discussed option of surgical excision to remove it if it is growing, symptomatic, or other changes noted. Please call for new or changing lesions so they can be evaluated.   Lentigines - Scattered tan macules - Due to sun exposure - Benign-appearing, observe - Recommend daily broad spectrum sunscreen SPF 30+ to sun-exposed areas, reapply every 2 hours as needed. - Call for any changes  Seborrheic Keratoses - Stuck-on, waxy, tan-brown papules and/or plaques  - Benign-appearing - Discussed benign etiology and prognosis. - Observe - Call for any changes  Melanocytic Nevi - Tan-brown and/or pink-flesh-colored symmetric macules and papules - Benign appearing on exam today - Observation - Call clinic for new or changing moles - Recommend daily use of broad spectrum spf 30+ sunscreen to sun-exposed areas.   Hemangiomas - Red papules - Discussed benign nature - Observe - Call for any changes  Actinic Damage - Chronic condition, secondary to  cumulative UV/sun exposure - diffuse scaly erythematous macules with underlying dyspigmentation - Recommend daily broad spectrum sunscreen SPF 30+ to sun-exposed areas, reapply every 2 hours as needed.  - Staying in the shade or wearing long sleeves, sun glasses (UVA+UVB protection) and wide brim hats  (4-inch brim around the entire circumference of the hat) are also recommended for sun protection.  - Call for new or changing lesions.  Purpura - Chronic; persistent and recurrent.  Treatable, but not curable. - Violaceous macules and patches - Benign - Related to trauma, age, sun damage and/or use of blood thinners, chronic use of topical and/or oral steroids - Observe - Can use OTC arnica containing moisturizer such as Dermend Bruise Formula if desired - Call for worsening or other concerns   Skin cancer screening performed today.   Return in about 1 year (around 07/02/2023) for TBSE, surgery on cyst and follow up on ISK .  I, Marye Round, CMA, am acting as scribe for Sarina Ser, MD .  Documentation: I have reviewed the above documentation for accuracy and completeness, and I agree with the above.  Sarina Ser, MD

## 2022-07-01 NOTE — Patient Instructions (Addendum)
Cryotherapy Aftercare  Wash gently with soap and water everyday.   Apply Vaseline and Band-Aid daily until healed.     Due to recent changes in healthcare laws, you may see results of your pathology and/or laboratory studies on MyChart before the doctors have had a chance to review them. We understand that in some cases there may be results that are confusing or concerning to you. Please understand that not all results are received at the same time and often the doctors may need to interpret multiple results in order to provide you with the best plan of care or course of treatment. Therefore, we ask that you please give us 2 business days to thoroughly review all your results before contacting the office for clarification. Should we see a critical lab result, you will be contacted sooner.   If You Need Anything After Your Visit  If you have any questions or concerns for your doctor, please call our main line at [phone] and press option 4 to reach your doctor's medical assistant. If no one answers, please leave a voicemail as directed and we will return your call as soon as possible. Messages left after 4 pm will be answered the following business day.   You may also send us a message via MyChart. We typically respond to MyChart messages within 1-2 business days.  For prescription refills, please ask your pharmacy to contact our office. Our fax number is [phone].  If you have an urgent issue when the clinic is closed that cannot wait until the next business day, you can page your doctor at the number below.    Please note that while we do our best to be available for urgent issues outside of office hours, we are not available 24/7.   If you have an urgent issue and are unable to reach us, you may choose to seek medical care at your doctor's office, retail clinic, urgent care center, or emergency room.  If you have a medical emergency, please immediately call 911 or go to the  emergency department.  Pager Numbers  - Dr. Kowalski: [phone]  - Dr. Moye: [phone]  - Dr. Stewart: [phone]  In the event of inclement weather, please call our main line at [phone] for an update on the status of any delays or closures.  Dermatology Medication Tips: Please keep the boxes that topical medications come in in order to help keep track of the instructions about where and how to use these. Pharmacies typically print the medication instructions only on the boxes and not directly on the medication tubes.   If your medication is too expensive, please contact our office at [phone] option 4 or send us a message through MyChart.   We are unable to tell what your co-pay for medications will be in advance as this is different depending on your insurance coverage. However, we may be able to find a substitute medication at lower cost or fill out paperwork to get insurance to cover a needed medication.   If a prior authorization is required to get your medication covered by your insurance company, please allow us 1-2 business days to complete this process.  Drug prices often vary depending on where the prescription is filled and some pharmacies may offer cheaper prices.  The website www.goodrx.com contains coupons for medications through different pharmacies. The prices here do not account for what the cost may be with help from insurance (it may be cheaper with your insurance), but the website can   give you the price if you did not use any insurance.  - You can print the associated coupon and take it with your prescription to the pharmacy.  - You may also stop by our office during regular business hours and pick up a GoodRx coupon card.  - If you need your prescription sent electronically to a different pharmacy, notify our office through [place] MyChart or by phone at [phone] option 4.     Si Usted Necesita Algo Despus de Su Visita  Tambin puede  enviarnos un mensaje a travs de MyChart. Por lo general respondemos a los mensajes de MyChart en el transcurso de 1 a 2 das hbiles.  Para renovar recetas, por favor pida a su farmacia que se ponga en contacto con nuestra oficina. Nuestro nmero de fax es el [phone].  Si tiene un asunto urgente cuando la clnica est cerrada y que no puede esperar hasta el siguiente da hbil, puede llamar/localizar a su doctor(a) al nmero que aparece a continuacin.   Por favor, tenga en cuenta que aunque hacemos todo lo posible para estar disponibles para asuntos urgentes fuera del horario de oficina, no estamos disponibles las 24 horas del da, los 7 das de la semana.   Si tiene un problema urgente y no puede comunicarse con nosotros, puede optar por buscar atencin mdica  en el consultorio de su doctor(a), en una clnica privada, en un centro de atencin urgente o en una sala de emergencias.  Si tiene una emergencia mdica, por favor llame inmediatamente al 911 o vaya a la sala de emergencias.  Nmeros de bper  - Dr. Kowalski: [phone]  - Dra. Moye: [phone]  - Dra. Stewart: [phone]  En caso de inclemencias del tiempo, por favor llame a nuestra lnea principal al [phone] para una actualizacin sobre el estado de cualquier retraso o cierre.  Consejos para la medicacin en dermatologa: Por favor, guarde las cajas en las que vienen los medicamentos de uso tpico para ayudarle a seguir las instrucciones sobre dnde y cmo usarlos. Las farmacias generalmente imprimen las instrucciones del medicamento slo en las cajas y no directamente en los tubos del medicamento.   Si su medicamento es muy caro, por favor, pngase en contacto con nuestra oficina llamando al [phone] y presione la opcin 4 o envenos un mensaje a travs de MyChart.   No podemos decirle cul ser su copago por los medicamentos por adelantado ya que esto es diferente dependiendo de la cobertura de su seguro.  Sin embargo, es posible que podamos encontrar un medicamento sustituto a menor costo o llenar un formulario para que el seguro cubra el medicamento que se considera necesario.   Si se requiere una autorizacin previa para que su compaa de seguros cubra su medicamento, por favor permtanos de 1 a 2 das hbiles para completar este proceso.  Los precios de los medicamentos varan con frecuencia dependiendo del lugar de dnde se surte la receta y alguna farmacias pueden ofrecer precios ms baratos.  El sitio web www.goodrx.com tiene cupones para medicamentos de diferentes farmacias. Los precios aqu no tienen en cuenta lo que podra costar con la ayuda del seguro (puede ser ms barato con su seguro), pero el sitio web puede darle el precio si no utiliz ningn seguro.  - Puede imprimir el cupn correspondiente y llevarlo con su receta a la farmacia.  - Tambin puede pasar por nuestra oficina durante el horario de atencin regular y recoger una tarjeta de cupones de GoodRx.  -   Si necesita que su receta se enve electrnicamente a una farmacia diferente, informe a nuestra oficina a travs de MyChart de [place] o por telfono llamando al [phone] y presione la opcin 4.  

## 2022-07-08 ENCOUNTER — Other Ambulatory Visit: Payer: Self-pay | Admitting: Cardiology

## 2022-07-10 ENCOUNTER — Encounter: Payer: Self-pay | Admitting: Dermatology

## 2022-07-28 ENCOUNTER — Ambulatory Visit: Payer: Medicare Other | Admitting: Dermatology

## 2022-07-28 ENCOUNTER — Encounter: Payer: Self-pay | Admitting: Dermatology

## 2022-07-28 ENCOUNTER — Telehealth: Payer: Self-pay

## 2022-07-28 VITALS — BP 133/69 | HR 58

## 2022-07-28 DIAGNOSIS — D492 Neoplasm of unspecified behavior of bone, soft tissue, and skin: Secondary | ICD-10-CM

## 2022-07-28 DIAGNOSIS — D234 Other benign neoplasm of skin of scalp and neck: Secondary | ICD-10-CM | POA: Diagnosis not present

## 2022-07-28 MED ORDER — MUPIROCIN 2 % EX OINT: 1.0000 | TOPICAL_OINTMENT | Freq: Every day | CUTANEOUS | 1 refills | Status: AC

## 2022-07-28 NOTE — Patient Instructions (Signed)

## 2022-07-28 NOTE — Progress Notes (Deleted)
   Follow-Up Visit   Subjective  Kenneth Armstrong is a 81 y.o. male who presents for the following: Cyst (Vs other of lat lat vertex scalp - excise today). ISK recheck other lesions have resolved except for the ISK on the scalp.   The following portions of the chart were reviewed this encounter and updated as appropriate:       Review of Systems:  No other skin or systemic complaints except as noted in HPI or Assessment and Plan.  Objective  Well appearing patient in no apparent distress; mood and affect are within normal limits.  A focused examination was performed including the face and scalp. Relevant physical exam findings are noted in the Assessment and Plan.  Left lat vertex scalp Cystic papule    Assessment & Plan  Neoplasm of skin Left lat vertex scalp  Skin excision  Informed consent: discussed and consent obtained   Timeout: patient name, date of birth, surgical site, and procedure verified   Procedure prep:  Patient was prepped and draped in usual sterile fashion Prep type:  Isopropyl alcohol and povidone-iodine Anesthesia: the lesion was anesthetized in a standard fashion   Anesthetic:  1% lidocaine w/ epinephrine 1-100,000 buffered w/ 8.4% NaHCO3 Instrument used: #15 blade   Hemostasis achieved with: pressure   Hemostasis achieved with comment:  Electrocautery Outcome: patient tolerated procedure well with no complications   Post-procedure details: sterile dressing applied and wound care instructions given   Dressing type: bandage and pressure dressing (Mupirocin)    Skin repair Complexity:  Complex Reason for type of repair: reduce tension to allow closure, reduce the risk of dehiscence, infection, and necrosis, reduce subcutaneous dead space and avoid a hematoma, allow closure of the large defect, preserve normal anatomy, preserve normal anatomical and functional relationships and enhance both functionality and cosmetic results   Undermining: area extensively  undermined   Undermining comment:  Undermining Defect Subcutaneous layers (deep stitches):  Suture type: Vicryl (polyglactin 910)   Subcutaneous suture technique: Inverted Dermal. Fine/surface layer approximation (top stitches):  Stitches: simple running   Suture removal (days):  7 Hemostasis achieved with: pressure Outcome: patient tolerated procedure well with no complications   Post-procedure details: sterile dressing applied and wound care instructions given   Dressing type: bandage, pressure dressing and bacitracin (Mupirocin)    Specimen 1 - Surgical pathology Differential Diagnosis: Cyst vs other Check Margins: No  Mupirocin ointment qd with dressing changes   Return in about 1 week (around 08/04/2022) for suture removal.  I, Rudell Cobb, CMA, am acting as scribe for Sarina Ser, MD .

## 2022-07-28 NOTE — Progress Notes (Signed)
   Follow-Up Visit   Subjective  Kenneth Armstrong is a 81 y.o. male who presents for the following: Cyst (Vs other of lat lat vertex scalp - excise today).  The following portions of the chart were reviewed this encounter and updated as appropriate:   Tobacco  Allergies  Meds  Problems  Med Hx  Surg Hx  Fam Hx     Review of Systems:  No other skin or systemic complaints except as noted in HPI or Assessment and Plan.  Objective  Well appearing patient in no apparent distress; mood and affect are within normal limits.  A focused examination was performed including scalp. Relevant physical exam findings are noted in the Assessment and Plan.  Left lat vertex scalp Cystic papule 2.2cm   Assessment & Plan  Neoplasm of skin Left lat vertex scalp  Skin excision  Lesion length (cm):  2.2 Lesion width (cm):  2.2 Margin per side (cm):  0 Total excision diameter (cm):  2.2 Informed consent: discussed and consent obtained   Timeout: patient name, date of birth, surgical site, and procedure verified   Procedure prep:  Patient was prepped and draped in usual sterile fashion Prep type:  Isopropyl alcohol and povidone-iodine Anesthesia: the lesion was anesthetized in a standard fashion   Anesthetic:  1% lidocaine w/ epinephrine 1-100,000 buffered w/ 8.4% NaHCO3 (3cc lido w/ epi, 3cc bupivicaine, Total of 6cc) Instrument used comment:  #15c blade Hemostasis achieved with: pressure   Hemostasis achieved with comment:  Electrocautery Outcome: patient tolerated procedure well with no complications   Post-procedure details: sterile dressing applied and wound care instructions given   Dressing type: bandage and pressure dressing (Mupirocin)    Skin repair Complexity:  Complex Final length (cm):  2.2 Reason for type of repair: reduce tension to allow closure, reduce the risk of dehiscence, infection, and necrosis, reduce subcutaneous dead space and avoid a hematoma, allow closure of the  large defect, preserve normal anatomy, preserve normal anatomical and functional relationships and enhance both functionality and cosmetic results   Undermining: area extensively undermined   Undermining comment:  Undermining Defect 2.2cm Subcutaneous layers (deep stitches):  Suture size:  4-0 Suture type: Vicryl (polyglactin 910)   Subcutaneous suture technique: Inverted Dermal. Fine/surface layer approximation (top stitches):  Suture size:  3-0 Suture type: nylon   Stitches: horizontal mattress and simple interrupted   Suture removal (days):  7 Hemostasis achieved with: pressure Outcome: patient tolerated procedure well with no complications   Post-procedure details: sterile dressing applied and wound care instructions given   Dressing type: bandage, pressure dressing and bacitracin (Mupirocin)   Additional details:  Simple interrupted x 2, horizontal mattress x 1, plan to remove the simple interrupted in 1 week and the horizontal mattress in 2 wks  mupirocin ointment (BACTROBAN) 2 % Apply 1 Application topically daily. Qd to excision site  Specimen 1 - Surgical pathology Differential Diagnosis: Cyst vs other Check Margins: no  Mupirocin ointment qd with dressing changes   Return in about 1 week (around 08/04/2022) for suture removal only simple interrupted on f/u.  I, Othelia Pulling, RMA, am acting as scribe for Sarina Ser, MD . Documentation: I have reviewed the above documentation for accuracy and completeness, and I agree with the above.  Sarina Ser, MD

## 2022-07-28 NOTE — Telephone Encounter (Signed)
Pt doing well after todays surgery./sh 

## 2022-07-29 ENCOUNTER — Telehealth: Payer: Self-pay

## 2022-07-29 DIAGNOSIS — H532 Diplopia: Secondary | ICD-10-CM

## 2022-07-29 NOTE — Telephone Encounter (Signed)
Advised the patient the following from Dr. Nehemiah Massed: Patient's surgery was on the superficial skin of the vertex scalp no where near the eyes and even anesthesia would be worn off and metabolized by now.  It seems highly unlikely that his visual problem could be related to the surgery.  One way or another, his visual changes would need to be evaluated by an eye doctor = Ophthalmologist.  I would recommend he contact one today.  If he needs referral, we can make an urgent referral for "new onset double vision"   Patient would like for [place] to place referral to [facility].  Referral placed and pt scheduled with Dr. Edison Pace for today at 3:30.  Pt advised of appointment./sh

## 2022-07-29 NOTE — Telephone Encounter (Signed)
Patient left message this am at 7:52 that he woke up with double vision and is concerned it could be caused by yesterdays surgery.

## 2022-08-04 ENCOUNTER — Ambulatory Visit (INDEPENDENT_AMBULATORY_CARE_PROVIDER_SITE_OTHER): Payer: Medicare Other | Admitting: Dermatology

## 2022-08-04 DIAGNOSIS — D485 Neoplasm of uncertain behavior of skin: Secondary | ICD-10-CM

## 2022-08-04 DIAGNOSIS — D492 Neoplasm of unspecified behavior of bone, soft tissue, and skin: Secondary | ICD-10-CM

## 2022-08-04 DIAGNOSIS — Z4802 Encounter for removal of sutures: Secondary | ICD-10-CM

## 2022-08-04 NOTE — Patient Instructions (Signed)
Due to recent changes in healthcare laws, you may see results of your pathology and/or laboratory studies on MyChart before the doctors have had a chance to review them. We understand that in some cases there may be results that are confusing or concerning to you. Please understand that not all results are received at the same time and often the doctors may need to interpret multiple results in order to provide you with the best plan of care or course of treatment. Therefore, we ask that you please give us 2 business days to thoroughly review all your results before contacting the office for clarification. Should we see a critical lab result, you will be contacted sooner.   If You Need Anything After Your Visit  If you have any questions or concerns for your doctor, please call our main line at [phone] and press option 4 to reach your doctor's medical assistant. If no one answers, please leave a voicemail as directed and we will return your call as soon as possible. Messages left after 4 pm will be answered the following business day.   You may also send us a message via MyChart. We typically respond to MyChart messages within 1-2 business days.  For prescription refills, please ask your pharmacy to contact our office. Our fax number is [phone].  If you have an urgent issue when the clinic is closed that cannot wait until the next business day, you can page your doctor at the number below.    Please note that while we do our best to be available for urgent issues outside of office hours, we are not available 24/7.   If you have an urgent issue and are unable to reach us, you may choose to seek medical care at your doctor's office, retail clinic, urgent care center, or emergency room.  If you have a medical emergency, please immediately call 911 or go to the emergency department.  Pager Numbers  - Dr. Kowalski: [phone]  - Dr. Moye: [phone]  - Dr. Stewart:  [phone]  In the event of inclement weather, please call our main line at [phone] for an update on the status of any delays or closures.  Dermatology Medication Tips: Please keep the boxes that topical medications come in in order to help keep track of the instructions about where and how to use these. Pharmacies typically print the medication instructions only on the boxes and not directly on the medication tubes.   If your medication is too expensive, please contact our office at [phone] option 4 or send us a message through MyChart.   We are unable to tell what your co-pay for medications will be in advance as this is different depending on your insurance coverage. However, we may be able to find a substitute medication at lower cost or fill out paperwork to get insurance to cover a needed medication.   If a prior authorization is required to get your medication covered by your insurance company, please allow us 1-2 business days to complete this process.  Drug prices often vary depending on where the prescription is filled and some pharmacies may offer cheaper prices.  The website www.goodrx.com contains coupons for medications through different pharmacies. The prices here do not account for what the cost may be with help from insurance (it may be cheaper with your insurance), but the website can give you the price if you did not use any insurance.  - You can print the associated coupon and take it with   your prescription to the pharmacy.  - You may also stop by our office during regular business hours and pick up a GoodRx coupon card.  - If you need your prescription sent electronically to a different pharmacy, notify our office through [facility] MyChart or by phone at [phone] option 4.     Si Usted Necesita Algo Despus de Su Visita  Tambin puede enviarnos un mensaje a travs de MyChart. Por lo general respondemos a los mensajes de MyChart en el transcurso de 1 a 2  das hbiles.  Para renovar recetas, por favor pida a su farmacia que se ponga en contacto con nuestra oficina. Nuestro nmero de fax es el [phone].  Si tiene un asunto urgente cuando la clnica est cerrada y que no puede esperar hasta el siguiente da hbil, puede llamar/localizar a su doctor(a) al nmero que aparece a continuacin.   Por favor, tenga en cuenta que aunque hacemos todo lo posible para estar disponibles para asuntos urgentes fuera del horario de oficina, no estamos disponibles las 24 horas del da, los 7 das de la semana.   Si tiene un problema urgente y no puede comunicarse con nosotros, puede optar por buscar atencin mdica  en el consultorio de su doctor(a), en una clnica privada, en un centro de atencin urgente o en una sala de emergencias.  Si tiene una emergencia mdica, por favor llame inmediatamente al 911 o vaya a la sala de emergencias.  Nmeros de bper  - Dr. Kowalski: [phone]  - Dra. Moye: [phone]  - Dra. Stewart: [phone]  En caso de inclemencias del tiempo, por favor llame a nuestra lnea principal al [phone] para una actualizacin sobre el estado de cualquier retraso o cierre.  Consejos para la medicacin en dermatologa: Por favor, guarde las cajas en las que vienen los medicamentos de uso tpico para ayudarle a seguir las instrucciones sobre dnde y cmo usarlos. Las farmacias generalmente imprimen las instrucciones del medicamento slo en las cajas y no directamente en los tubos del medicamento.   Si su medicamento es muy caro, por favor, pngase en contacto con nuestra oficina llamando al [phone] y presione la opcin 4 o envenos un mensaje a travs de MyChart.   No podemos decirle cul ser su copago por los medicamentos por adelantado ya que esto es diferente dependiendo de la cobertura de su seguro. Sin embargo, es posible que podamos encontrar un medicamento sustituto a menor costo o llenar un formulario para que el  seguro cubra el medicamento que se considera necesario.   Si se requiere una autorizacin previa para que su compaa de seguros cubra su medicamento, por favor permtanos de 1 a 2 das hbiles para completar este proceso.  Los precios de los medicamentos varan con frecuencia dependiendo del lugar de dnde se surte la receta y alguna farmacias pueden ofrecer precios ms baratos.  El sitio web www.goodrx.com tiene cupones para medicamentos de diferentes farmacias. Los precios aqu no tienen en cuenta lo que podra costar con la ayuda del seguro (puede ser ms barato con su seguro), pero el sitio web puede darle el precio si no utiliz ningn seguro.  - Puede imprimir el cupn correspondiente y llevarlo con su receta a la farmacia.  - Tambin puede pasar por nuestra oficina durante el horario de atencin regular y recoger una tarjeta de cupones de GoodRx.  - Si necesita que su receta se enve electrnicamente a una farmacia diferente, informe a nuestra oficina a travs de MyChart de    o por telfono llamando al [phone] y presione la opcin 4.  

## 2022-08-04 NOTE — Progress Notes (Signed)
   Follow-Up Visit   Subjective  Kenneth Armstrong is a 81 y.o. male who presents for the following: Cyst vs other (L lat vertex scalp, pt presents for suture removal, pt did have double vision the day after surgery for 1 day only, pt did go to Ophthalmologist and he confirmed the double vision).  The following portions of the chart were reviewed this encounter and updated as appropriate:   Tobacco  Allergies  Meds  Problems  Med Hx  Surg Hx  Fam Hx     Review of Systems:  No other skin or systemic complaints except as noted in HPI or Assessment and Plan.  Objective  Well appearing patient in no apparent distress; mood and affect are within normal limits.  A focused examination was performed including scalp. Relevant physical exam findings are noted in the Assessment and Plan.  L lat vertex scalp Healing excision site   Assessment & Plan  Neoplasm of skin L lat vertex scalp  Cyst vs other, healing excision site  Encounter for Removal of Sutures - Incision site at the L lat vertex scalp is clean, dry and intact - Wound cleansed, simple interrupted sutures removed, wound cleansed.  Patient will return in 1 week to remove horizontal mattress suture x 1  - Will call patient with pathology results - Patient advised to keep steri-strips dry until they fall off. - Scars remodel for a full year. - Once steri-strips fall off, patient can apply over-the-counter silicone scar cream each night to help with scar remodeling if desired. - Patient advised to call with any concerns or if they notice any new or changing lesions.   Related Medications mupirocin ointment (BACTROBAN) 2 % Apply 1 Application topically daily. Qd to excision site   Return in about 1 week (around 08/11/2022) for suture removal x 1 horizontal mattress.  I, Othelia Pulling, RMA, am acting as scribe for Sarina Ser, MD . Documentation: I have reviewed the above documentation for accuracy and completeness, and I  agree with the above.  Sarina Ser, MD

## 2022-08-05 ENCOUNTER — Telehealth: Payer: Self-pay

## 2022-08-05 NOTE — Telephone Encounter (Signed)
Discussed biopsy results with patient 

## 2022-08-05 NOTE — Telephone Encounter (Signed)
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

## 2022-08-09 ENCOUNTER — Encounter: Payer: Self-pay | Admitting: Dermatology

## 2022-08-11 ENCOUNTER — Encounter: Payer: Self-pay | Admitting: Dermatology

## 2022-08-11 ENCOUNTER — Ambulatory Visit: Payer: Medicare Other | Admitting: Dermatology

## 2022-08-11 DIAGNOSIS — D239 Other benign neoplasm of skin, unspecified: Secondary | ICD-10-CM

## 2022-08-11 DIAGNOSIS — D234 Other benign neoplasm of skin of scalp and neck: Secondary | ICD-10-CM

## 2022-08-11 NOTE — Progress Notes (Signed)
   Follow-Up Visit   Subjective  Kenneth Armstrong is a 81 y.o. male who presents for the following: Follow-up (Post op - Hidrocystoma).  The following portions of the chart were reviewed this encounter and updated as appropriate:   Tobacco  Allergies  Meds  Problems  Med Hx  Surg Hx  Fam Hx     Review of Systems:  No other skin or systemic complaints except as noted in HPI or Assessment and Plan.  Objective  Well appearing patient in no apparent distress; mood and affect are within normal limits.  A focused examination was performed including scalp. Relevant physical exam findings are noted in the Assessment and Plan.  Left lat vertex Healing excision site   Assessment & Plan  Hidrocystoma Left lat vertex  Encounter for Removal of Sutures - Incision site at the left lat vertex is clean, dry and intact - Wound cleansed, sutures removed, wound cleansed and steri strips applied.  - Discussed pathology results showing hidrocystoma  - Patient advised to keep steri-strips dry until they fall off. - Scars remodel for a full year. - Once steri-strips fall off, patient can apply over-the-counter silicone scar cream each night to help with scar remodeling if desired. - Patient advised to call with any concerns or if they notice any new or changing lesions.    Return if symptoms worsen or fail to improve.  I, Kenneth Armstrong, CMA, am acting as scribe for Kenneth Ser, MD . Documentation: I have reviewed the above documentation for accuracy and completeness, and I agree with the above.  Kenneth Ser, MD

## 2022-08-11 NOTE — Patient Instructions (Signed)
Due to recent changes in healthcare laws, you may see results of your pathology and/or laboratory studies on MyChart before the doctors have had a chance to review them. We understand that in some cases there may be results that are confusing or concerning to you. Please understand that not all results are received at the same time and often the doctors may need to interpret multiple results in order to provide you with the best plan of care or course of treatment. Therefore, we ask that you please give us 2 business days to thoroughly review all your results before contacting the office for clarification. Should we see a critical lab result, you will be contacted sooner.   If You Need Anything After Your Visit  If you have any questions or concerns for your doctor, please call our main line at [phone] and press option 4 to reach your doctor's medical assistant. If no one answers, please leave a voicemail as directed and we will return your call as soon as possible. Messages left after 4 pm will be answered the following business day.   You may also send us a message via MyChart. We typically respond to MyChart messages within 1-2 business days.  For prescription refills, please ask your pharmacy to contact our office. Our fax number is [phone].  If you have an urgent issue when the clinic is closed that cannot wait until the next business day, you can page your doctor at the number below.    Please note that while we do our best to be available for urgent issues outside of office hours, we are not available 24/7.   If you have an urgent issue and are unable to reach us, you may choose to seek medical care at your doctor's office, retail clinic, urgent care center, or emergency room.  If you have a medical emergency, please immediately call 911 or go to the emergency department.  Pager Numbers  - Dr. Kowalski: [phone]  - Dr. Moye: [phone]  - Dr. Stewart:  [phone]  In the event of inclement weather, please call our main line at [phone] for an update on the status of any delays or closures.  Dermatology Medication Tips: Please keep the boxes that topical medications come in in order to help keep track of the instructions about where and how to use these. Pharmacies typically print the medication instructions only on the boxes and not directly on the medication tubes.   If your medication is too expensive, please contact our office at [phone] option 4 or send us a message through MyChart.   We are unable to tell what your co-pay for medications will be in advance as this is different depending on your insurance coverage. However, we may be able to find a substitute medication at lower cost or fill out paperwork to get insurance to cover a needed medication.   If a prior authorization is required to get your medication covered by your insurance company, please allow us 1-2 business days to complete this process.  Drug prices often vary depending on where the prescription is filled and some pharmacies may offer cheaper prices.  The website www.goodrx.com contains coupons for medications through different pharmacies. The prices here do not account for what the cost may be with help from insurance (it may be cheaper with your insurance), but the website can give you the price if you did not use any insurance.  - You can print the associated coupon and take it with   your prescription to the pharmacy.  - You may also stop by our office during regular business hours and pick up a GoodRx coupon card.  - If you need your prescription sent electronically to a different pharmacy, notify our office through [facility] MyChart or by phone at [phone] option 4.     Si Usted Necesita Algo Despus de Su Visita  Tambin puede enviarnos un mensaje a travs de MyChart. Por lo general respondemos a los mensajes de MyChart en el transcurso de 1 a 2  das hbiles.  Para renovar recetas, por favor pida a su farmacia que se ponga en contacto con nuestra oficina. Nuestro nmero de fax es el [phone].  Si tiene un asunto urgente cuando la clnica est cerrada y que no puede esperar hasta el siguiente da hbil, puede llamar/localizar a su doctor(a) al nmero que aparece a continuacin.   Por favor, tenga en cuenta que aunque hacemos todo lo posible para estar disponibles para asuntos urgentes fuera del horario de oficina, no estamos disponibles las 24 horas del da, los 7 das de la semana.   Si tiene un problema urgente y no puede comunicarse con nosotros, puede optar por buscar atencin mdica  en el consultorio de su doctor(a), en una clnica privada, en un centro de atencin urgente o en una sala de emergencias.  Si tiene una emergencia mdica, por favor llame inmediatamente al 911 o vaya a la sala de emergencias.  Nmeros de bper  - Dr. Kowalski: [phone]  - Dra. Moye: [phone]  - Dra. Stewart: [phone]  En caso de inclemencias del tiempo, por favor llame a nuestra lnea principal al [phone] para una actualizacin sobre el estado de cualquier retraso o cierre.  Consejos para la medicacin en dermatologa: Por favor, guarde las cajas en las que vienen los medicamentos de uso tpico para ayudarle a seguir las instrucciones sobre dnde y cmo usarlos. Las farmacias generalmente imprimen las instrucciones del medicamento slo en las cajas y no directamente en los tubos del medicamento.   Si su medicamento es muy caro, por favor, pngase en contacto con nuestra oficina llamando al [phone] y presione la opcin 4 o envenos un mensaje a travs de MyChart.   No podemos decirle cul ser su copago por los medicamentos por adelantado ya que esto es diferente dependiendo de la cobertura de su seguro. Sin embargo, es posible que podamos encontrar un medicamento sustituto a menor costo o llenar un formulario para que el  seguro cubra el medicamento que se considera necesario.   Si se requiere una autorizacin previa para que su compaa de seguros cubra su medicamento, por favor permtanos de 1 a 2 das hbiles para completar este proceso.  Los precios de los medicamentos varan con frecuencia dependiendo del lugar de dnde se surte la receta y alguna farmacias pueden ofrecer precios ms baratos.  El sitio web www.goodrx.com tiene cupones para medicamentos de diferentes farmacias. Los precios aqu no tienen en cuenta lo que podra costar con la ayuda del seguro (puede ser ms barato con su seguro), pero el sitio web puede darle el precio si no utiliz ningn seguro.  - Puede imprimir el cupn correspondiente y llevarlo con su receta a la farmacia.  - Tambin puede pasar por nuestra oficina durante el horario de atencin regular y recoger una tarjeta de cupones de GoodRx.  - Si necesita que su receta se enve electrnicamente a una farmacia diferente, informe a nuestra oficina a travs de MyChart de [place]   o por telfono llamando al [phone] y presione la opcin 4.  

## 2022-08-21 ENCOUNTER — Encounter: Payer: Self-pay | Admitting: Dermatology

## 2022-10-05 ENCOUNTER — Other Ambulatory Visit: Payer: Self-pay | Admitting: Cardiology

## 2022-10-05 NOTE — Telephone Encounter (Signed)
Rx refill sent to pharmacy. 

## 2023-01-01 ENCOUNTER — Other Ambulatory Visit: Payer: Self-pay | Admitting: Cardiology

## 2023-01-01 NOTE — Telephone Encounter (Signed)
Refill to pharmacy, needs appointment for future refills 1st attempt 

## 2023-02-12 ENCOUNTER — Ambulatory Visit: Payer: Medicare Other | Attending: Cardiology | Admitting: Cardiology

## 2023-02-12 ENCOUNTER — Encounter: Payer: Self-pay | Admitting: Cardiology

## 2023-02-12 VITALS — BP 120/82 | HR 61 | Ht 71.0 in | Wt 191.0 lb

## 2023-02-12 DIAGNOSIS — R0789 Other chest pain: Secondary | ICD-10-CM | POA: Diagnosis not present

## 2023-02-12 DIAGNOSIS — I2510 Atherosclerotic heart disease of native coronary artery without angina pectoris: Secondary | ICD-10-CM | POA: Diagnosis not present

## 2023-02-12 DIAGNOSIS — E785 Hyperlipidemia, unspecified: Secondary | ICD-10-CM | POA: Diagnosis not present

## 2023-02-12 NOTE — Progress Notes (Signed)
Cardiology Office Note:    Date:  02/12/2023   ID:  [name], Kenneth Armstrong 1941-01-02, MRN [id number]  PCP:  Kenneth Peak, PA-C  Cardiologist:  Kenneth Balsam, MD    Referring MD: Kenneth Peak, PA-C   Chief Complaint  Patient presents with   Follow-up  Doing very well  History of Present Illness:    Kenneth Armstrong is a 82 y.o. male past medical history significant for coronary artery disease, cardiac catheterization January 2020 showed 25% mid LAD, 30% diagonal, 25% mid circumflex, 20% RCA lesion additional problem include essential hypertension, dyslipidemia.  Comes today to months for follow-up we will doing very well no chest pain tightness squeezing pressure burning chest still exercise on the regular basis walking at least 1 mile a day.  Within next few days he is turning 82 and he is doing quite well.  He describes few situation when he turned his head very quickly he lost balance look like he may be having some inner ear i issues.  I recommend to see ENT if it became better some  Past Medical History:  Diagnosis Date   Abnormal stress test 08/09/2020   Arthritis    LUMBAR   Atypical chest pain 06/14/2020   BPH (benign prostatic hyperplasia)    Dizziness 06/14/2020   Dyslipidemia 06/14/2020   GERD (gastroesophageal reflux disease)    Hiatal hernia    History of palpitations    Hx of Lyme disease    Lower urinary tract symptoms (LUTS)    Palpitations 06/14/2020   Wears glasses     Past Surgical History:  Procedure Laterality Date   CARDIOVASCULAR STRESS TEST  12/05/2008   normal nuclear study w/ no ischemia/  normal LV function and wall motion , ef 70%   CATARACT EXTRACTION W/PHACO Right 12/19/2015   Procedure: CATARACT EXTRACTION PHACO AND INTRAOCULAR LENS PLACEMENT (IOC);  Surgeon: Kenneth Mola, MD;  Location: [facility] ORS;  Service: Ophthalmology;  Laterality: Right;US 40.0AP%7.6CDE3.03FluidPackLot # [id number]   COLONOSCOPY     INGUINAL HERNIA REPAIR Right early  2000's   LEFT HEART CATH AND CORONARY ANGIOGRAPHY N/A 08/29/2020   Procedure: LEFT HEART CATH AND CORONARY ANGIOGRAPHY;  Surgeon: Armstrong, Kenneth M, MD;  Location: [facility] INVASIVE CV LAB;  Service: Cardiovascular;  Laterality: N/A;   PILONIDAL CYST / SINUS EXCISION  1960's   TONSILLECTOMY  1949   VARICOSE VEIN SURGERY  1950's and 1970's    Current Medications: Current Meds  Medication Sig   acetaminophen (TYLENOL) 500 MG tablet Take 1,000 mg by mouth every 6 (six) hours as needed for headache.   aspirin EC 81 MG tablet Take 1 tablet (81 mg total) by mouth daily.   atorvastatin (LIPITOR) 10 MG tablet Take 1 tablet (10 mg total) by mouth daily. Needs appointment for future refills  1st attempt   ibuprofen (ADVIL,MOTRIN) 200 MG tablet Take 400 mg by mouth daily as needed for mild pain or moderate pain (Back pain).   Multiple Vitamin (MULITIVITAMIN WITH MINERALS) TABS Take 1 tablet by mouth daily. Unknown strength   mupirocin ointment (BACTROBAN) 2 % Apply 1 Application topically daily. Qd to excision site   nitroGLYCERIN (NITROSTAT) 0.4 MG SL tablet Place 1 tablet (0.4 mg total) under the tongue every 5 (five) minutes as needed for chest pain.   omeprazole (PRILOSEC) 20 MG capsule Take 20 mg by mouth every morning.    OVER THE COUNTER MEDICATION Take 2,000 mg by mouth daily. SUPER FRUIT AND VEGGIES   pimecrolimus (ELIDEL)  1 % cream Apply to affected skin once a day (Patient taking differently: Apply 1 Application topically 2 (two) times daily. Apply to affected skin once a day)     Allergies:   Patient has no known allergies.   Social History   Socioeconomic History   Marital status: Married    Spouse name: Not on file   Number of children: Not on file   Years of education: Not on file   Highest education level: Not on file  Occupational History   Occupation: retired  Tobacco Use   Smoking status: Former    Packs/day: 1.00    Years: 10.00    Additional pack years: 0.00    Total pack  years: 10.00    Types: Cigarettes    Quit date: 08/24/1968    Years since quitting: 54.5   Smokeless tobacco: Never  Substance and Sexual Activity   Alcohol use: Yes    Comment: occasional    Drug use: No   Sexual activity: Not on file  Other Topics Concern   Not on file  Social History Narrative   Not on file   Social Determinants of Health   Financial Resource Strain: Not on file  Food Insecurity: Not on file  Transportation Needs: Not on file  Physical Activity: Not on file  Stress: Not on file  Social Connections: Not on file     Family History: The patient's family history includes Colon cancer in his brother; Coronary artery disease in his father; Hypertension in his mother; Lung cancer in his father. ROS:   Please see the history of present illness.    All 14 point review of systems negative except as described per history of present illness  EKGs/Labs/Other Studies Reviewed:      Recent Labs: No results found for requested labs within last 365 days.  Recent Lipid Panel    Component Value Date/Time   CHOL 157 10/11/2020 0843   TRIG 70 10/11/2020 0843   HDL 74 10/11/2020 0843   CHOLHDL 2.1 10/11/2020 0843   LDLCALC 69 10/11/2020 0843    Physical Exam:    VS:  BP 120/82 (BP Location: Left Arm, Patient Position: Sitting)   Pulse 61   Ht 5\' 11"  (1.803 m)   Wt 191 lb (86.6 kg)   SpO2 95%   BMI 26.64 kg/m     Wt Readings from Last 3 Encounters:  02/12/23 191 lb (86.6 kg)  12/04/21 192 lb 6.4 oz (87.3 kg)  03/11/21 189 lb (85.7 kg)     GEN:  Well nourished, well developed in no acute distress HEENT: Normal NECK: No JVD; No carotid bruits LYMPHATICS: No lymphadenopathy CARDIAC: RRR, no murmurs, no rubs, no gallops RESPIRATORY:  Clear to auscultation without rales, wheezing or rhonchi  ABDOMEN: Soft, non-tender, non-distended MUSCULOSKELETAL:  No edema; No deformity  SKIN: Warm and dry LOWER EXTREMITIES: no swelling NEUROLOGIC:  Alert and oriented  x 3 PSYCHIATRIC:  Normal affect   ASSESSMENT:    1. Coronary artery disease involving native coronary artery of native heart without angina pectoris   2. Dyslipidemia   3. Atypical chest pain    PLAN:    In order of problems listed above:  Coronary artery disease stable from that point review asymptomatic continue platelets inhibition as well as statin. Dyslipidemia I did review his K PN which show me his LDL of 54 HDL 70.  Continue with Lipitor 10. Atypical chest pain denies having any. Balance issue I did talk  to him about potentially seeing ENT if it became troubling   Medication Adjustments/Labs and Tests Ordered: Current medicines are reviewed at length with the patient today.  Concerns regarding medicines are outlined above.  Orders Placed This Encounter  Procedures   EKG 12-Lead   Medication changes: No orders of the defined types were placed in this encounter.   Signed, Georgeanna Lea, MD, [facility] 02/12/2023 10:53 AM    [facility]

## 2023-02-12 NOTE — Patient Instructions (Signed)

## 2023-03-31 ENCOUNTER — Other Ambulatory Visit: Payer: Self-pay | Admitting: Cardiology

## 2023-05-11 DIAGNOSIS — Z9181 History of falling: Secondary | ICD-10-CM | POA: Diagnosis not present

## 2023-05-11 DIAGNOSIS — Z139 Encounter for screening, unspecified: Secondary | ICD-10-CM | POA: Diagnosis not present

## 2023-05-11 DIAGNOSIS — Z0000 Encounter for general adult medical examination without abnormal findings: Secondary | ICD-10-CM | POA: Diagnosis not present

## 2023-06-27 ENCOUNTER — Other Ambulatory Visit: Payer: Self-pay | Admitting: Cardiology

## 2023-07-06 DIAGNOSIS — Z23 Encounter for immunization: Secondary | ICD-10-CM | POA: Diagnosis not present

## 2023-07-06 DIAGNOSIS — I83893 Varicose veins of bilateral lower extremities with other complications: Secondary | ICD-10-CM | POA: Diagnosis not present

## 2023-07-06 DIAGNOSIS — I2510 Atherosclerotic heart disease of native coronary artery without angina pectoris: Secondary | ICD-10-CM | POA: Diagnosis not present

## 2023-07-06 DIAGNOSIS — Z79899 Other long term (current) drug therapy: Secondary | ICD-10-CM | POA: Diagnosis not present

## 2023-07-06 DIAGNOSIS — Z0000 Encounter for general adult medical examination without abnormal findings: Secondary | ICD-10-CM | POA: Diagnosis not present

## 2023-07-06 DIAGNOSIS — Z131 Encounter for screening for diabetes mellitus: Secondary | ICD-10-CM | POA: Diagnosis not present

## 2023-07-06 DIAGNOSIS — N3281 Overactive bladder: Secondary | ICD-10-CM | POA: Diagnosis not present

## 2023-12-23 ENCOUNTER — Other Ambulatory Visit: Payer: Self-pay | Admitting: Cardiology

## 2023-12-23 NOTE — Telephone Encounter (Signed)
 Prescription sent to pharmacy.

## 2024-02-21 ENCOUNTER — Ambulatory Visit

## 2024-02-21 ENCOUNTER — Encounter: Payer: Self-pay | Admitting: Cardiology

## 2024-02-21 ENCOUNTER — Ambulatory Visit: Attending: Cardiology | Admitting: Cardiology

## 2024-02-21 VITALS — BP 120/72 | HR 69 | Ht 71.0 in | Wt 183.8 lb

## 2024-02-21 DIAGNOSIS — E785 Hyperlipidemia, unspecified: Secondary | ICD-10-CM | POA: Diagnosis not present

## 2024-02-21 DIAGNOSIS — R002 Palpitations: Secondary | ICD-10-CM

## 2024-02-21 DIAGNOSIS — R42 Dizziness and giddiness: Secondary | ICD-10-CM

## 2024-02-21 DIAGNOSIS — I2510 Atherosclerotic heart disease of native coronary artery without angina pectoris: Secondary | ICD-10-CM

## 2024-02-21 NOTE — Progress Notes (Unsigned)
 Cardiology Office Note:    Date:  02/21/2024   ID:  Taksh, Hjort 01/11/1941, MRN [id number]  PCP:  Montey Lot, PA-C  Cardiologist:  Lamar Fitch, MD    Referring MD: Montey Lot, PA-C   Chief Complaint  Patient presents with   Bruises    Hands    History of Present Illness:    Kenneth Armstrong is a 83 y.o. male  past medical history significant for coronary artery disease, cardiac catheterization January 2020 showed 25% mid LAD, 30% diagonal, 25% mid circumflex, 20% RCA lesion additional problem include essential hypertension, dyslipidemia.  Comes today to my office for follow-up.  Overall doing well.  He is 83 years old still very active walks every single day 1 mile.  Complain of having balance issue however hemoglobin worried about maybe there is more to him.  He described episode sometimes when he is driving a car he feels that way not to the point of passing out he decided to drive less because of that.  Describe 1 episode which was at a gas station he was trying to step on the curb missed his step fell down he said he blacked out for split-second he struck his head more he may have had concussion at that time.  And it was months ago.  Otherwise he is doing excellent no chest pain tightness squeezing pressure burning chest  Past Medical History:  Diagnosis Date   Abnormal stress test 08/09/2020   Arthritis    LUMBAR   Atypical chest pain 06/14/2020   BPH (benign prostatic hyperplasia)    Dizziness 06/14/2020   Dyslipidemia 06/14/2020   GERD (gastroesophageal reflux disease)    Hiatal hernia    History of palpitations    Hx of Lyme disease    Lower urinary tract symptoms (LUTS)    Palpitations 06/14/2020   Wears glasses     Past Surgical History:  Procedure Laterality Date   CARDIOVASCULAR STRESS TEST  12/05/2008   normal nuclear study w/ no ischemia/  normal LV function and wall motion , ef 70%   CATARACT EXTRACTION W/PHACO Right 12/19/2015   Procedure:  CATARACT EXTRACTION PHACO AND INTRAOCULAR LENS PLACEMENT (IOC);  Surgeon: Dene Etienne, MD;  Location: [facility] ORS;  Service: Ophthalmology;  Laterality: Right;US  40.0AP%7.6CDE3.03FluidPackLot # [id number]   COLONOSCOPY     INGUINAL HERNIA REPAIR Right early 2000's   LEFT HEART CATH AND CORONARY ANGIOGRAPHY N/A 08/29/2020   Procedure: LEFT HEART CATH AND CORONARY ANGIOGRAPHY;  Surgeon: [place], Peter M, MD;  Location: [facility] INVASIVE CV LAB;  Service: Cardiovascular;  Laterality: N/A;   PILONIDAL CYST / SINUS EXCISION  1960's   TONSILLECTOMY  1949   VARICOSE VEIN SURGERY  1950's and 1970's    Current Medications: Current Meds  Medication Sig   acetaminophen  (TYLENOL ) 500 MG tablet Take 1,000 mg by mouth every 6 (six) hours as needed for headache.   aspirin  EC 81 MG tablet Take 1 tablet (81 mg total) by mouth daily.   atorvastatin  (LIPITOR) 10 MG tablet Take 1 tablet (10 mg total) by mouth daily. Patient needs appointment for further refills   ibuprofen (ADVIL,MOTRIN) 200 MG tablet Take 400 mg by mouth daily as needed for mild pain or moderate pain (Back pain).   Multiple Vitamin (MULITIVITAMIN WITH MINERALS) TABS Take 1 tablet by mouth daily. Unknown strength   mupirocin  ointment (BACTROBAN ) 2 % Apply 1 Application topically daily. Qd to excision site   nitroGLYCERIN  (NITROSTAT ) 0.4 MG SL tablet Place  1 tablet (0.4 mg total) under the tongue every 5 (five) minutes as needed for chest pain.   omeprazole (PRILOSEC) 20 MG capsule Take 20 mg by mouth every morning.    OVER THE COUNTER MEDICATION Take 2,000 mg by mouth daily. SUPER FRUIT AND VEGGIES   pimecrolimus  (ELIDEL ) 1 % cream Apply to affected skin once a day (Patient taking differently: Apply 1 Application topically 2 (two) times daily. Apply to affected skin once a day)     Allergies:   Patient has no known allergies.   Social History   Socioeconomic History   Marital status: Married    Spouse name: Not on file   Number of children: Not  on file   Years of education: Not on file   Highest education level: Not on file  Occupational History   Occupation: retired  Tobacco Use   Smoking status: Former    Current packs/day: 0.00    Average packs/day: 1 pack/day for 10.0 years (10.0 ttl pk-yrs)    Types: Cigarettes    Start date: 08/24/1958    Quit date: 08/24/1968    Years since quitting: 55.5   Smokeless tobacco: Never  Substance and Sexual Activity   Alcohol use: Yes    Comment: occasional    Drug use: No   Sexual activity: Not on file  Other Topics Concern   Not on file  Social History Narrative   Not on file   Social Drivers of Health   Financial Resource Strain: Not on file  Food Insecurity: Not on file  Transportation Needs: Not on file  Physical Activity: Not on file  Stress: Not on file  Social Connections: Not on file     Family History: The patient's family history includes Colon cancer in his brother; Coronary artery disease in his father; Hypertension in his mother; Lung cancer in his father. ROS:   Please see the history of present illness.    All 14 point review of systems negative except as described per history of present illness  EKGs/Labs/Other Studies Reviewed:    EKG Interpretation Date/Time:  Monday February 21 2024 09:20:07 EDT Ventricular Rate:  69 PR Interval:  166 QRS Duration:  84 QT Interval:  406 QTC Calculation: 435 R Axis:   22  Text Interpretation: Sinus rhythm with Premature atrial complexes Nonspecific ST abnormality Abnormal ECG Confirmed by Bernie Charleston [id number]) on 02/21/2024 9:25:06 AM    Recent Labs: No results found for requested labs within last 365 days.  Recent Lipid Panel    Component Value Date/Time   CHOL 157 10/11/2020 0843   TRIG 70 10/11/2020 0843   HDL 74 10/11/2020 0843   CHOLHDL 2.1 10/11/2020 0843   LDLCALC 69 10/11/2020 0843    Physical Exam:    VS:  BP 120/72 (BP Location: Right Arm, Patient Position: Sitting)   Pulse 69   Ht 5' 11  (1.803 m)   Wt 183 lb 12.8 oz (83.4 kg)   SpO2 99%   BMI 25.63 kg/m     Wt Readings from Last 3 Encounters:  02/21/24 183 lb 12.8 oz (83.4 kg)  02/12/23 191 lb (86.6 kg)  12/04/21 192 lb 6.4 oz (87.3 kg)     GEN:  Well nourished, well developed in no acute distress HEENT: Normal NECK: No JVD; No carotid bruits LYMPHATICS: No lymphadenopathy CARDIAC: RRR, no murmurs, no rubs, no gallops RESPIRATORY:  Clear to auscultation without rales, wheezing or rhonchi  ABDOMEN: Soft, non-tender, non-distended MUSCULOSKELETAL:  No  edema; No deformity  SKIN: Warm and dry LOWER EXTREMITIES: no swelling NEUROLOGIC:  Alert and oriented x 3 PSYCHIATRIC:  Normal affect   ASSESSMENT:    1. Coronary artery disease involving native coronary artery of native heart without angina pectoris   2. Dyslipidemia   3. Dizziness    PLAN:    In order of problems listed above:  Coronary disease stable from that point review on guideline directed medical therapy he brought some issue about bruising with aspirin  and I told him that is a small price to pay for preventing him from having coronary events. Dyslipidemia I did review K PN which show me LDL 58 HDL 71 this is from end of last year perfect control continue present management. Dizziness I will be more concerned about those episodes now we will put him on Zio patch for 2 weeks to see if he get any significant arrhythmia   Medication Adjustments/Labs and Tests Ordered: Current medicines are reviewed at length with the patient today.  Concerns regarding medicines are outlined above.  Orders Placed This Encounter  Procedures   EKG 12-Lead   Medication changes: No orders of the defined types were placed in this encounter.   Signed, Lamar DOROTHA Fitch, MD, [facility] 02/21/2024 9:47 AM    [facility]

## 2024-02-21 NOTE — Patient Instructions (Addendum)
Medication Instructions:  Your physician recommends that you continue on your current medications as directed. Please refer to the Current Medication list given to you today.  *If you need a refill on your cardiac medications before your next appointment, please call your pharmacy*   Lab Work: None Ordered If you have labs (blood work) drawn today and your tests are completely normal, you will receive your results only by: [place] (if you have MyChart) OR A paper copy in the mail If you have any lab test that is abnormal or we need to change your treatment, we will call you to review the results.   Testing/Procedures:  WHY IS MY DOCTOR PRESCRIBING ZIO? The Zio system is proven and trusted by physicians to detect and diagnose irregular heart rhythms -- and has been prescribed to hundreds of thousands of patients.  The FDA has cleared the Zio system to monitor for many different kinds of irregular heart rhythms. In a study, physicians were able to reach a diagnosis 90% of the time with the Zio system1.  You can wear the Zio monitor -- a small, discreet, comfortable patch -- during your normal day-to-day activity, including while you sleep, shower, and exercise, while it records every single heartbeat for analysis.  1Barrett, P., et al. Comparison of 24 Hour Holter Monitoring Versus 14 Day Novel Adhesive Patch Electrocardiographic Monitoring. [place], 2014.  ZIO VS. HOLTER MONITORING The Zio monitor can be comfortably worn for up to 14 days. Holter monitors can be worn for 24 to 48 hours, limiting the time to record any irregular heart rhythms you may have. Zio is able to capture data for the 51% of patients who have their first symptom-triggered arrhythmia after 48 hours.1  LIVE WITHOUT RESTRICTIONS The Zio ambulatory cardiac monitor is a small, unobtrusive, and water-resistant patch--you might even forget you're wearing it. The Zio monitor records and stores  every beat of your heart, whether you're sleeping, working out, or showering.     Follow-Up: At [facility], you and your health needs are our priority.  As part of our continuing mission to provide you with exceptional heart care, we have created designated Provider Care Teams.  These Care Teams include your primary Cardiologist (physician) and Advanced Practice Providers (APPs -  Physician Assistants and Nurse Practitioners) who all work together to provide you with the care you need, when you need it.  We recommend signing up for the patient portal called "MyChart".  Sign up information is provided on this After Visit Summary.  MyChart is used to connect with patients for Virtual Visits (Telemedicine).  Patients are able to view lab/test results, encounter notes, upcoming appointments, etc.  Non-urgent messages can be sent to your provider as well.   To learn more about what you can do with MyChart, go to [email or web address].    Your next appointment:   12 month(s)  The format for your next appointment:   In Person  Provider:   Jenne Campus, MD    Other Instructions NA

## 2024-03-18 ENCOUNTER — Other Ambulatory Visit: Payer: Self-pay | Admitting: Cardiology

## 2024-03-20 DIAGNOSIS — R002 Palpitations: Secondary | ICD-10-CM | POA: Diagnosis not present

## 2024-04-04 DIAGNOSIS — R002 Palpitations: Secondary | ICD-10-CM | POA: Diagnosis not present

## 2024-04-10 ENCOUNTER — Ambulatory Visit: Payer: Self-pay | Admitting: Cardiology

## 2024-04-12 MED ORDER — METOPROLOL SUCCINATE ER 25 MG PO TB24: 12.5000 mg | ORAL_TABLET | Freq: Every day | ORAL | 3 refills | Status: AC

## 2024-04-12 NOTE — Telephone Encounter (Signed)
-----   Message from Lamar Fitch sent at 04/10/2024 12:20 PM EDT ----- Monitor shows some episode of ventricular tachycardia as well as supraventricular tachycardia.  We can start with metoprolol  titrate 12.5 twice daily ----- Message ----- From: Krasowski, Robert J, MD Sent: 04/04/2024  12:28 PM EDT To: Lamar JINNY Fitch, MD

## 2024-04-12 NOTE — Telephone Encounter (Signed)
 MyChart message
